# Patient Record
Sex: Female | Born: 1985 | Race: White | Hispanic: No | Marital: Married | Smoking: Former smoker
Health system: Southern US, Community
[De-identification: ages and names within clinical notes are randomized; demographics above are authoritative.]

## PROBLEM LIST (undated history)

## (undated) DIAGNOSIS — F419 Anxiety disorder, unspecified: Secondary | ICD-10-CM

## (undated) DIAGNOSIS — F32A Depression, unspecified: Secondary | ICD-10-CM

---
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
## 2007-06-15 ENCOUNTER — Emergency Department (HOSPITAL_COMMUNITY): Admission: EM | Admit: 2007-06-15 | Discharge: 2007-06-15 | Payer: Self-pay | Admitting: Emergency Medicine

## 2007-09-19 ENCOUNTER — Emergency Department (HOSPITAL_COMMUNITY): Admission: EM | Admit: 2007-09-19 | Discharge: 2007-09-19 | Payer: Self-pay | Admitting: Emergency Medicine

## 2007-10-24 ENCOUNTER — Emergency Department (HOSPITAL_COMMUNITY): Admission: EM | Admit: 2007-10-24 | Discharge: 2007-10-25 | Payer: Self-pay | Admitting: Emergency Medicine

## 2008-09-05 ENCOUNTER — Inpatient Hospital Stay (HOSPITAL_COMMUNITY): Admission: AD | Admit: 2008-09-05 | Discharge: 2008-09-07 | Payer: Self-pay | Admitting: Family Medicine

## 2008-09-05 ENCOUNTER — Ambulatory Visit: Payer: Self-pay | Admitting: Obstetrics and Gynecology

## 2008-09-10 ENCOUNTER — Inpatient Hospital Stay (HOSPITAL_COMMUNITY): Admission: AD | Admit: 2008-09-10 | Discharge: 2008-09-10 | Payer: Self-pay | Admitting: Obstetrics & Gynecology

## 2009-01-07 IMAGING — CR DG WRIST COMPLETE 3+V*R*
4 series · 4 of 4 positions shown · non-contrast
Comparison: none

CLINICAL DATA: Fall, wrist pain

RIGHT WRIST - 4 VIEW

[x wrist pa right]
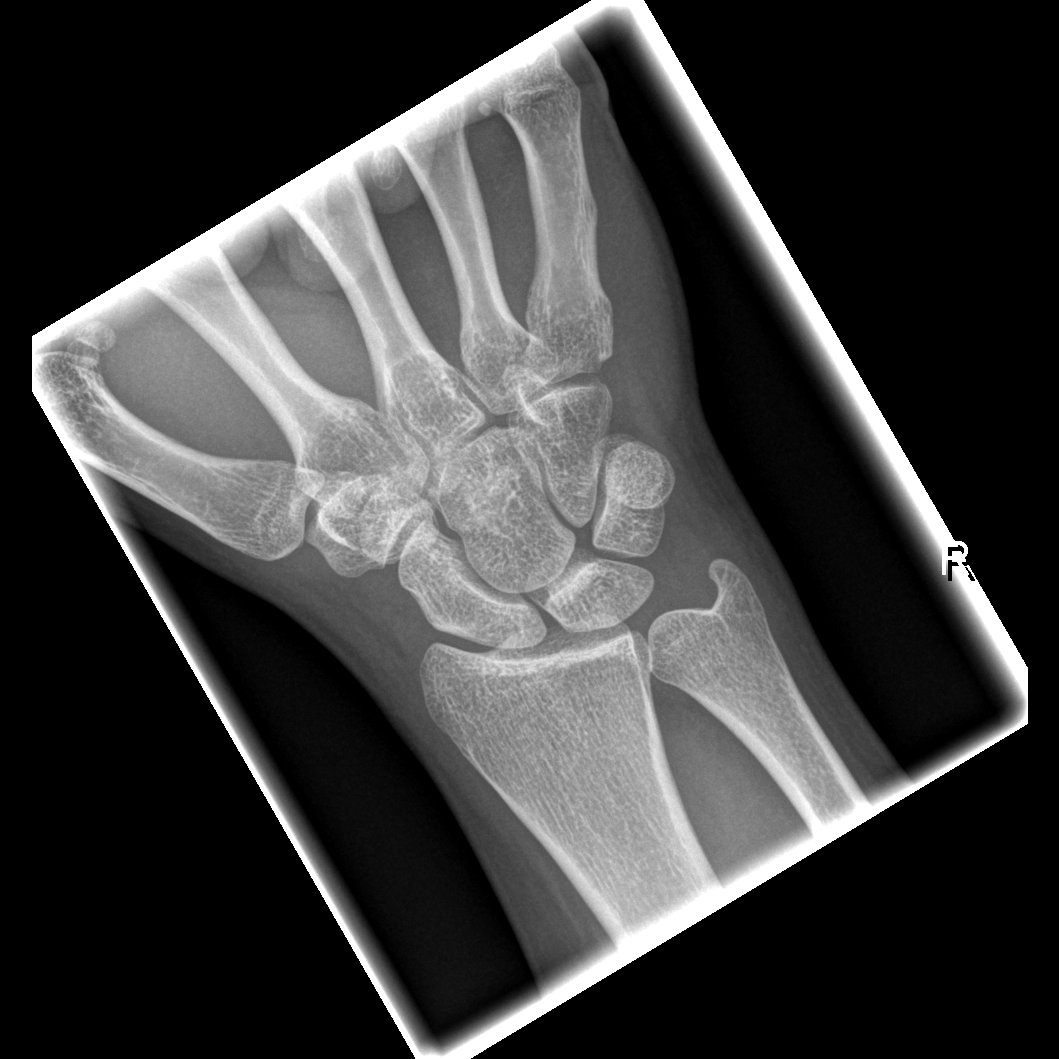

[x wrist obl right]
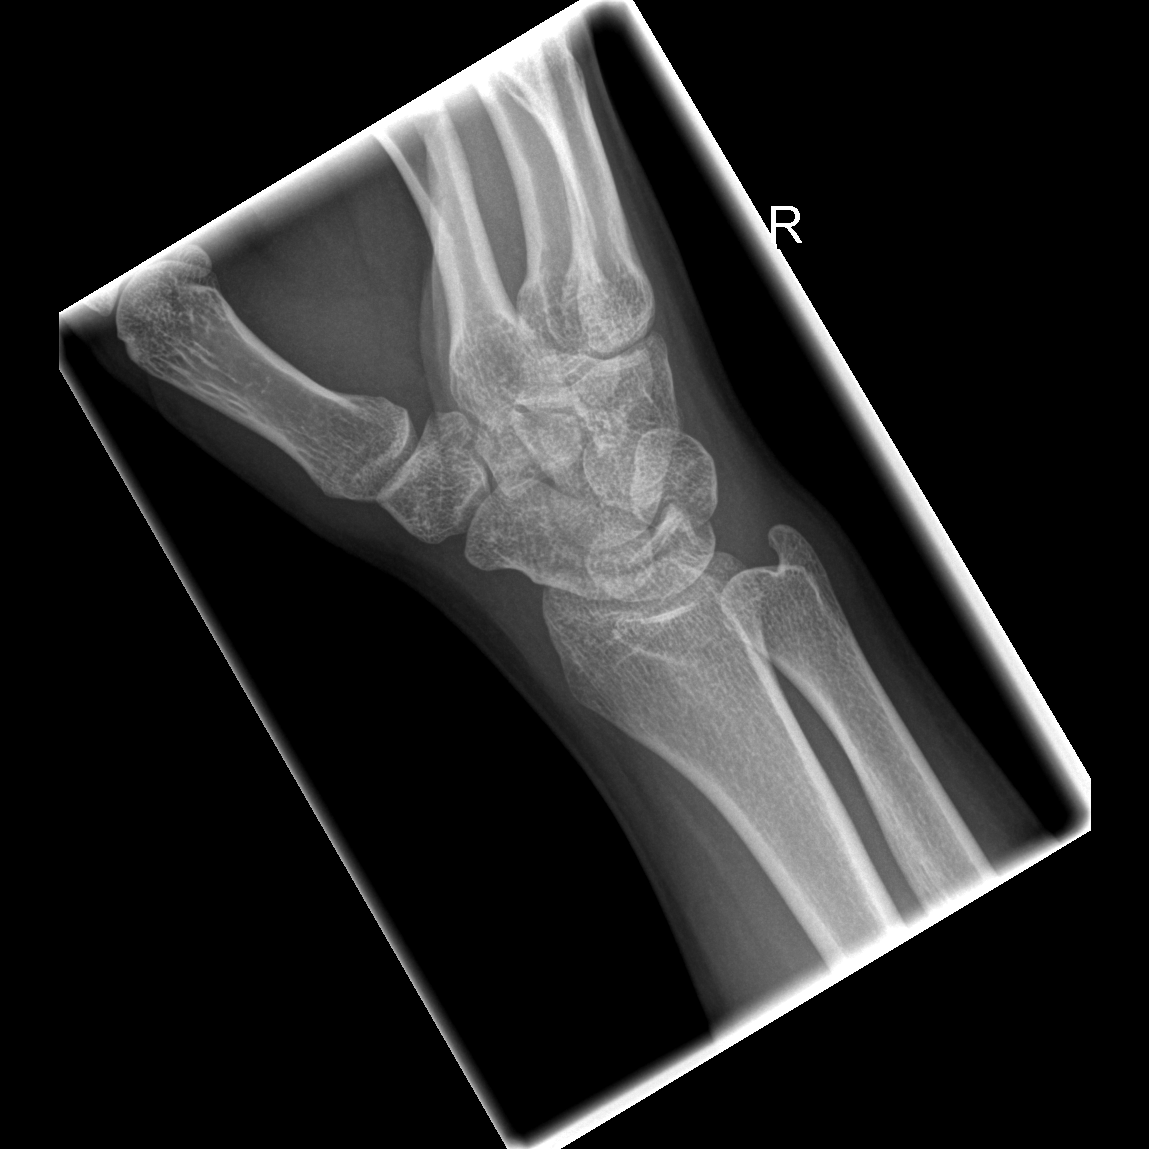

[x wrist lat right]
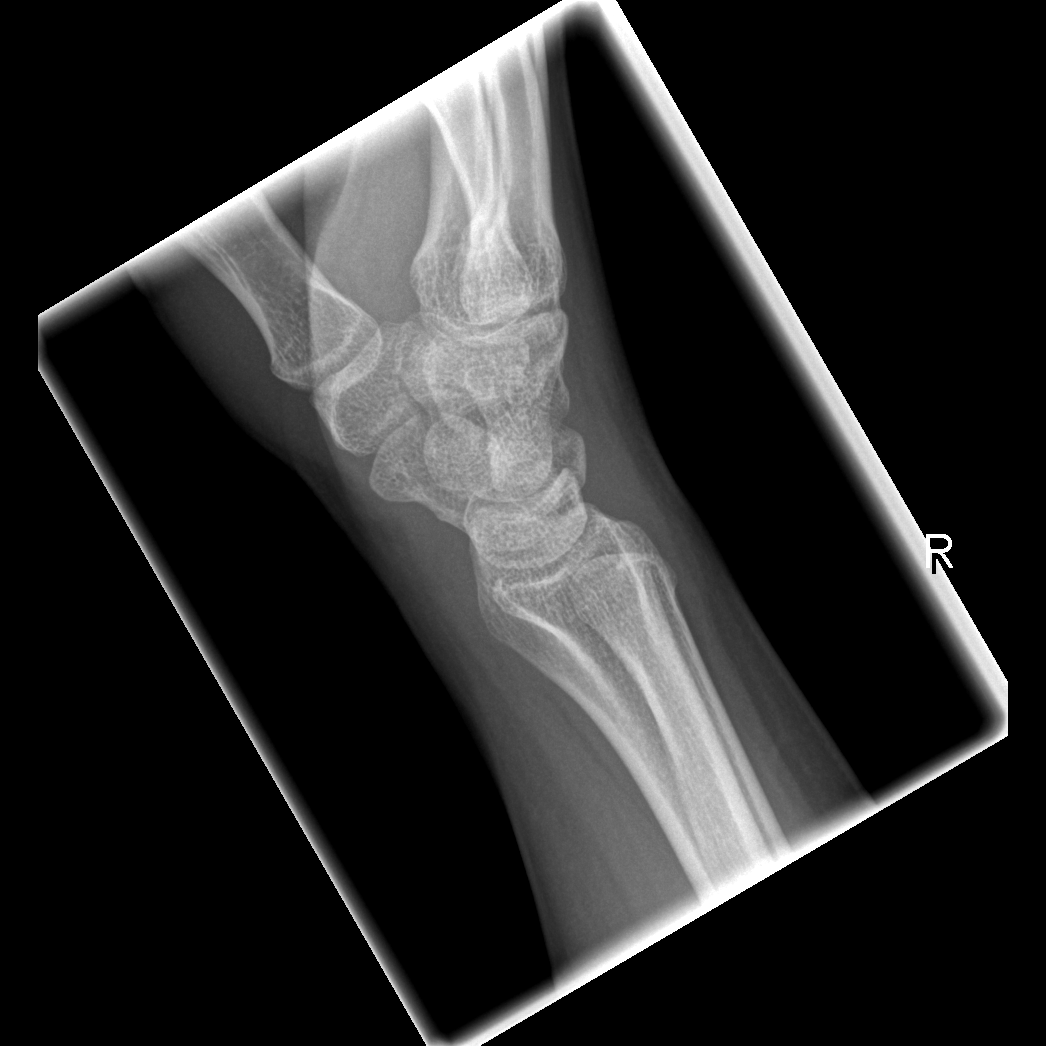

[x navicular]
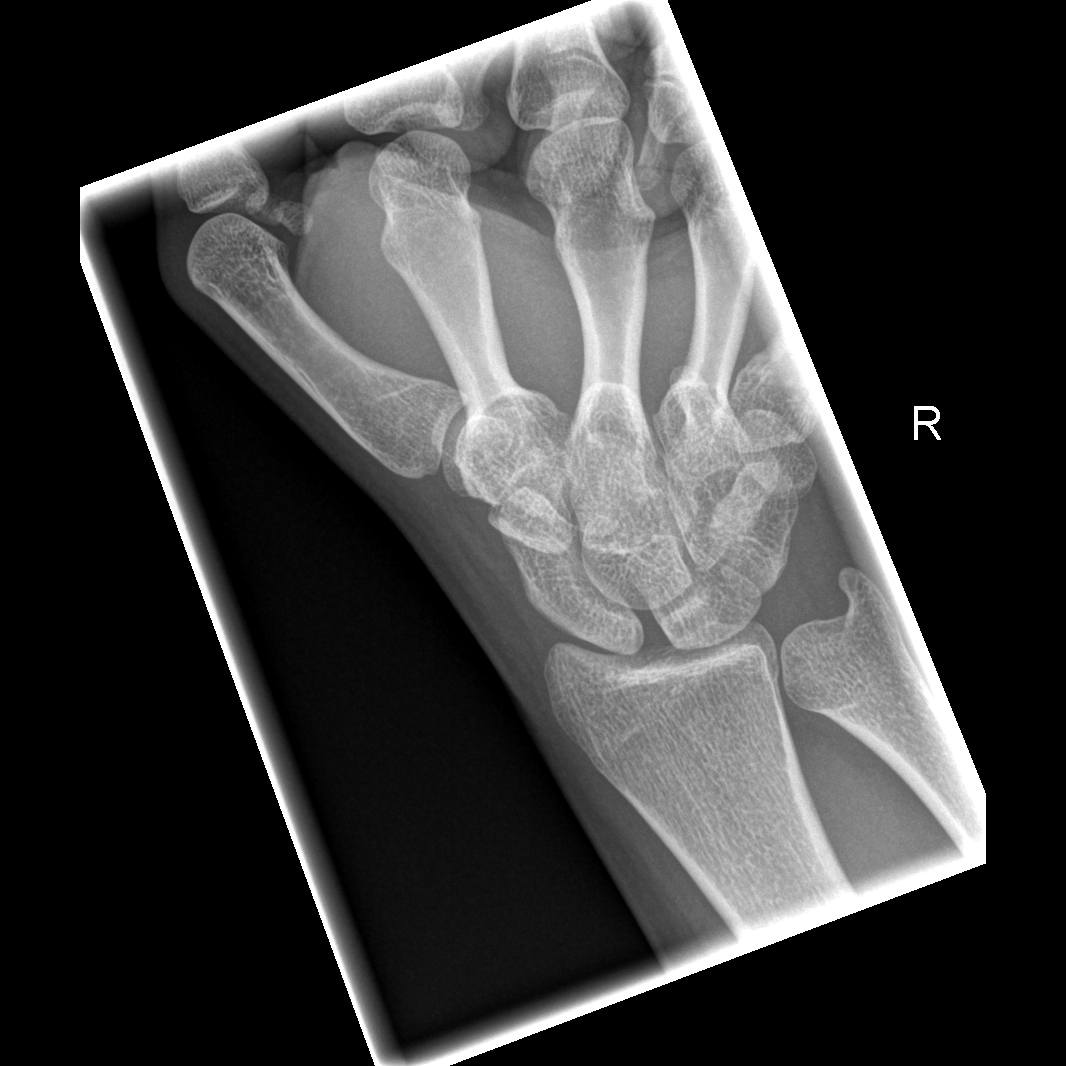

[4 of 4 positions shown; findings below may reference images not displayed]

FINDINGS: On the oblique view, there is slight apparent widening of the
scapholunate distance. This is not appreciated on the AP view and may simply be
related to the obliquity. No fracture. Soft tissues are intact.

IMPRESSION

Question slight widening of the scapholunate space. If clinical concern
persists, consider elective MRI.

## 2010-10-16 LAB — CBC
HCT: 29.3 % — ABNORMAL LOW (ref 36.0–46.0)
HCT: 32.9 % — ABNORMAL LOW (ref 36.0–46.0)
Hemoglobin: 11 g/dL — ABNORMAL LOW (ref 12.0–15.0)
Hemoglobin: 9.6 g/dL — ABNORMAL LOW (ref 12.0–15.0)
MCHC: 33 g/dL (ref 30.0–36.0)
MCHC: 33.5 g/dL (ref 30.0–36.0)
MCV: 93.1 fL (ref 78.0–100.0)
MCV: 94.3 fL (ref 78.0–100.0)
Platelets: 210 10*3/uL (ref 150–400)
RBC: 3.1 MIL/uL — ABNORMAL LOW (ref 3.87–5.11)
RBC: 3.53 MIL/uL — ABNORMAL LOW (ref 3.87–5.11)
RDW: 13.6 % (ref 11.5–15.5)
WBC: 16.8 10*3/uL — ABNORMAL HIGH (ref 4.0–10.5)
WBC: 17.6 10*3/uL — ABNORMAL HIGH (ref 4.0–10.5)

## 2010-10-16 LAB — URINE MICROSCOPIC-ADD ON

## 2010-10-16 LAB — URINALYSIS, ROUTINE W REFLEX MICROSCOPIC
Bilirubin Urine: NEGATIVE
Glucose, UA: NEGATIVE mg/dL
Ketones, ur: NEGATIVE mg/dL
Specific Gravity, Urine: >1.03 — ABNORMAL HIGH (ref 1.005–1.030)
pH: 6 (ref 5.0–8.0)

## 2010-10-16 LAB — RPR: RPR Ser Ql: NONREACTIVE

## 2010-10-16 LAB — CCBB MATERNAL DONOR DRAW

## 2010-10-16 LAB — URINE CULTURE

## 2011-03-31 LAB — DIFFERENTIAL
Basophils Absolute: 0
Basophils Relative: 0
Eosinophils Absolute: 0
Eosinophils Relative: 0
Lymphocytes Relative: 4 — ABNORMAL LOW
Lymphs Abs: 0.6 — ABNORMAL LOW
Monocytes Absolute: 0.4
Monocytes Relative: 3
Neutro Abs: 13.5 — ABNORMAL HIGH
Neutrophils Relative %: 93 — ABNORMAL HIGH

## 2011-03-31 LAB — URINALYSIS, ROUTINE W REFLEX MICROSCOPIC
Glucose, UA: NEGATIVE
Hgb urine dipstick: NEGATIVE
Nitrite: NEGATIVE
Protein, ur: NEGATIVE
Specific Gravity, Urine: 1.04 — ABNORMAL HIGH
Urobilinogen, UA: 1
pH: 7

## 2011-03-31 LAB — POCT PREGNANCY, URINE
Operator id: 299821
Preg Test, Ur: NEGATIVE

## 2011-03-31 LAB — CBC
HCT: 39.9
Hemoglobin: 13.6
MCHC: 34
MCV: 91.4
Platelets: 201
RBC: 4.36
RDW: 13.3
WBC: 14.5 — ABNORMAL HIGH

## 2011-03-31 LAB — BASIC METABOLIC PANEL WITH GFR
BUN: 11
Chloride: 101
Creatinine, Ser: 0.65
GFR calc non Af Amer: >60

## 2011-03-31 LAB — URINE MICROSCOPIC-ADD ON

## 2011-03-31 LAB — BASIC METABOLIC PANEL
CO2: 29
Calcium: 9.3
GFR calc Af Amer: >60
Glucose, Bld: 113 — ABNORMAL HIGH
Potassium: 3.6
Sodium: 139

## 2011-08-13 ENCOUNTER — Encounter (HOSPITAL_COMMUNITY): Payer: Self-pay | Admitting: Emergency Medicine

## 2011-08-13 ENCOUNTER — Emergency Department (HOSPITAL_COMMUNITY)
Admission: EM | Admit: 2011-08-13 | Discharge: 2011-08-13 | Disposition: A | Discharge status: Home / Self Care | Payer: Self-pay | Attending: Emergency Medicine | Admitting: Emergency Medicine

## 2011-08-13 DIAGNOSIS — X58XXXA Exposure to other specified factors, initial encounter: Secondary | ICD-10-CM | POA: Insufficient documentation

## 2011-08-13 DIAGNOSIS — S025XXA Fracture of tooth (traumatic), initial encounter for closed fracture: Secondary | ICD-10-CM | POA: Insufficient documentation

## 2011-08-13 DIAGNOSIS — K089 Disorder of teeth and supporting structures, unspecified: Secondary | ICD-10-CM | POA: Insufficient documentation

## 2011-08-13 MED ORDER — OXYCODONE-ACETAMINOPHEN 7.5-325 MG PO TABS: 1.0000 | ORAL_TABLET | ORAL | Status: AC | PRN

## 2011-08-13 MED ORDER — IBUPROFEN 600 MG PO TABS: 600.0000 mg | ORAL_TABLET | Freq: Four times a day (QID) | ORAL | Status: AC | PRN

## 2011-08-13 NOTE — ED Provider Notes (Signed)
History     CSN: [id number]  Arrival date & time 08/13/11  1531   First MD Initiated Contact with Patient 08/13/11 1615      Chief Complaint  Patient presents with  . Dental Pain     HPI Pt c/o left upper tooth pain after tooth brook off this am  History reviewed. No pertinent past medical history.  History reviewed. No pertinent past surgical history.  History reviewed. No pertinent family history.  History  Substance Use Topics  . Smoking status: Former Games developer  . Smokeless tobacco: Not on file  . Alcohol Use: Yes     occasional    OB History    Grav Para Term Preterm Abortions TAB SAB Ect Mult Living                  Review of Systems Negative except as noted in history of present illness Allergies  Penicillins  Home Medications   Current Outpatient Rx  Name Route Sig Dispense Refill  . ADULT MULTIVITAMIN W/MINERALS CH Oral Take 1 tablet by mouth daily.    . IBUPROFEN 600 MG PO TABS Oral Take 1 tablet (600 mg total) by mouth every 6 (six) hours as needed for pain. 30 tablet 0  . OXYCODONE-ACETAMINOPHEN 7.5-325 MG PO TABS Oral Take 1 tablet by mouth every 4 (four) hours as needed for pain. 30 tablet 0    BP 143/64  Pulse 78  Temp(Src) 97.5 F (36.4 C) (Oral)  Resp 18  SpO2 100%  Physical Exam  Nursing note and vitals reviewed. Constitutional: She is oriented to person, place, and time. She appears well-developed and well-nourished. No distress.  HENT:  Head: Normocephalic and atraumatic.  Mouth/Throat:    Eyes: Pupils are equal, round, and reactive to light.  Neck: Normal range of motion.  Cardiovascular: Normal rate and intact distal pulses.   Pulmonary/Chest: No respiratory distress.  Abdominal: Normal appearance. She exhibits no distension.  Musculoskeletal: Normal range of motion.  Neurological: She is alert and oriented to person, place, and time. No cranial nerve deficit.  Skin: Skin is warm and dry. No rash noted.  Psychiatric: She has  a normal mood and affect. Her behavior is normal.    ED Course  Procedures (including critical care time)  Labs Reviewed - No data to display No results found.   1. Fractured tooth       MDM         Nelia Shi, MD 08/13/11 [phone]

## 2011-08-13 NOTE — ED Notes (Signed)
Pt c/o left upper tooth pain after tooth brook off this am

## 2018-07-19 ENCOUNTER — Emergency Department (HOSPITAL_COMMUNITY): Payer: BLUE CROSS/BLUE SHIELD

## 2018-07-19 ENCOUNTER — Emergency Department (HOSPITAL_COMMUNITY)
Admission: EM | Admit: 2018-07-19 | Discharge: 2018-07-19 | Disposition: A | Discharge status: Home / Self Care | Payer: BLUE CROSS/BLUE SHIELD | Attending: Emergency Medicine | Admitting: Emergency Medicine

## 2018-07-19 ENCOUNTER — Encounter (HOSPITAL_COMMUNITY): Payer: Self-pay

## 2018-07-19 ENCOUNTER — Other Ambulatory Visit: Payer: Self-pay

## 2018-07-19 DIAGNOSIS — O4691 Antepartum hemorrhage, unspecified, first trimester: Secondary | ICD-10-CM | POA: Insufficient documentation

## 2018-07-19 DIAGNOSIS — Z79899 Other long term (current) drug therapy: Secondary | ICD-10-CM | POA: Insufficient documentation

## 2018-07-19 DIAGNOSIS — Z3A10 10 weeks gestation of pregnancy: Secondary | ICD-10-CM | POA: Diagnosis not present

## 2018-07-19 DIAGNOSIS — Z87891 Personal history of nicotine dependence: Secondary | ICD-10-CM | POA: Diagnosis not present

## 2018-07-19 DIAGNOSIS — R8271 Bacteriuria: Secondary | ICD-10-CM | POA: Diagnosis not present

## 2018-07-19 DIAGNOSIS — O4690 Antepartum hemorrhage, unspecified, unspecified trimester: Secondary | ICD-10-CM

## 2018-07-19 LAB — URINALYSIS, ROUTINE W REFLEX MICROSCOPIC
Bilirubin Urine: NEGATIVE
GLUCOSE, UA: NEGATIVE mg/dL
KETONES UR: NEGATIVE mg/dL
Nitrite: NEGATIVE
PH: 5 (ref 5.0–8.0)
Protein, ur: NEGATIVE mg/dL
Specific Gravity, Urine: 1.012 (ref 1.005–1.030)

## 2018-07-19 LAB — CBC
HEMATOCRIT: 37.9 % (ref 36.0–46.0)
HEMOGLOBIN: 12.4 g/dL (ref 12.0–15.0)
MCH: 30.5 pg (ref 26.0–34.0)
MCHC: 32.7 g/dL (ref 30.0–36.0)
MCV: 93.3 fL (ref 80.0–100.0)
Platelets: 273 10*3/uL (ref 150–400)
RBC: 4.06 MIL/uL (ref 3.87–5.11)
RDW: 12.4 % (ref 11.5–15.5)
WBC: 11.6 10*3/uL — AB (ref 4.0–10.5)
nRBC: 0 % (ref 0.0–0.2)

## 2018-07-19 LAB — BASIC METABOLIC PANEL
Anion gap: 10 (ref 5–15)
BUN: 7 mg/dL (ref 6–20)
CHLORIDE: 103 mmol/L (ref 98–111)
CO2: 22 mmol/L (ref 22–32)
Calcium: 9.3 mg/dL (ref 8.9–10.3)
Creatinine, Ser: 0.57 mg/dL (ref 0.44–1.00)
GFR calc Af Amer: >60 mL/min (ref >60)
GFR calc non Af Amer: >60 mL/min (ref >60)
Glucose, Bld: 96 mg/dL (ref 70–99)
POTASSIUM: 4.1 mmol/L (ref 3.5–5.1)
SODIUM: 135 mmol/L (ref 135–145)

## 2018-07-19 LAB — WET PREP, GENITAL
Clue Cells Wet Prep HPF POC: NONE SEEN
SPERM: NONE SEEN
TRICH WET PREP: NONE SEEN
Yeast Wet Prep HPF POC: NONE SEEN

## 2018-07-19 LAB — HCG, QUANTITATIVE, PREGNANCY: hCG, Beta Chain, Quant, S: 62980 m[IU]/mL — ABNORMAL HIGH (ref <5)

## 2018-07-19 MED ORDER — CEPHALEXIN 500 MG PO CAPS: 500.0000 mg | ORAL_CAPSULE | Freq: Two times a day (BID) | ORAL | 0 refills | Status: DC

## 2018-07-19 MED ORDER — ACETAMINOPHEN 325 MG PO TABS
650.0000 mg | ORAL_TABLET | Freq: Once | ORAL | Status: AC
  Administered 2018-07-19: 650 mg via ORAL
  Filled 2018-07-19: qty 2

## 2018-07-19 NOTE — ED Provider Notes (Signed)
[facility]   CSN: [id number] Arrival date & time: 07/19/18  1737     History   Chief Complaint Chief Complaint  Patient presents with  . Threatened Miscarriage    HPI Rebecca Arnold is a 33 y.o. female with a hx of G2P1A0 who presents to the ED with vaginal bleeding that started 30 minutes prior to ED arrival. Patient states she is approximately [redacted] weeks pregnant, currently w/ established OB care by Dr. Conard NovakEisenberg w/ [facility], had US that confirmed IUP. She relays today she started to have some vaginal bleeding describes as moderate amount of  Blood on the toilet paper when wiping. This has continued but decreased. No alleviating/aggravating factors. Denies associated abdominal pain, back pain, dysuria, nausea, vomiting, or diarrhea. She relays she did have abnormal cervical cytology and went for biopsy last week. She states after the procedure she was having some discharge, but nothing substantially abnormal, it was a bit pruritic she applied monistat with some improvement over past 24 hours.    Prior Results Reviewed:  07/19/17:  U/S: SINGLE GEST SAC, YOLK SAC AND IUP NOTED, + FHT'S 178 BPM, CX LONG &  CLOSED, MEAS C/W DATES (8+ WKS), BIL ADNEXA WNL - CLB  Ultrasound preformed to confirm viability. Pt was given her gestational  age as stated on the ultrasound report and EDC. Full anatomy scan will be  done at 18-22 weeks.   The above ultrasound was reviewed and interpreted by Dr. Evaristo BuryBarbara Eisenberg  HPI  History reviewed. No pertinent past medical history.  There are no active problems to display for this patient.   History reviewed. No pertinent surgical history.   OB History    Gravida  1   Para      Term      Preterm      AB      Living        SAB      TAB      Ectopic      Multiple      Live Births               Home Medications    Prior to Admission medications   Medication Sig Start  Date End Date Taking? Authorizing Provider  Multiple Vitamin (MULITIVITAMIN WITH MINERALS) TABS Take 1 tablet by mouth daily.    [provider]    Family History History reviewed. No pertinent family history.  Social History Social History   Tobacco Use  . Smoking status: Former Games developermoker  . Smokeless tobacco: Never Used  Substance Use Topics  . Alcohol use: Yes    Comment: occasional  . Drug use: No     Allergies   Penicillins   Review of Systems Review of Systems  Constitutional: Negative for chills and fever.  Respiratory: Negative for shortness of breath.   Cardiovascular: Negative for chest pain.  Gastrointestinal: Negative for abdominal pain, nausea and vomiting.  Genitourinary: Positive for vaginal bleeding and vaginal discharge. Negative for dysuria.  Musculoskeletal: Negative for back pain.  All other systems reviewed and are negative.    Physical Exam Updated Vital Signs BP (!) 143/88 (BP Location: Right Arm)   Pulse 92   Temp 97.8 F (36.6 C) (Oral)   Resp 18   SpO2 99%   Physical Exam Vitals signs and nursing note reviewed. Exam conducted with a chaperone present.  Constitutional:      General: She is not in acute  distress.    Appearance: She is well-developed. She is not toxic-appearing.  HENT:     Head: Normocephalic and atraumatic.  Eyes:     General:        Right eye: No discharge.        Left eye: No discharge.     Conjunctiva/sclera: Conjunctivae normal.  Neck:     Musculoskeletal: Neck supple.  Cardiovascular:     Rate and Rhythm: Normal rate and regular rhythm.  Pulmonary:     Effort: Pulmonary effort is normal. No respiratory distress.     Breath sounds: Normal breath sounds. No wheezing, rhonchi or rales.  Abdominal:     General: There is no distension.     Palpations: Abdomen is soft.     Tenderness: There is no abdominal tenderness. There is no guarding or rebound.  Genitourinary:    Labia:        Right: No rash or  tenderness.        Left: No rash or tenderness.      Vagina: No bleeding.     Cervix: Discharge (white thin) present. No cervical motion tenderness or cervical bleeding.     Adnexa:        Right: No mass or tenderness.         Left: No mass or tenderness.    Skin:    General: Skin is warm and dry.     Findings: No rash.  Neurological:     Mental Status: She is alert.     Comments: Clear speech.   Psychiatric:        Behavior: Behavior normal.    ED Treatments / Results  Labs (all labs ordered are listed, but only abnormal results are displayed) Labs Reviewed  HCG, QUANTITATIVE, PREGNANCY - Abnormal; Notable for the following components:      Result Value   hCG, Beta Chain, Quant, S 62,980 (*)    All other components within normal limits  CBC - Abnormal; Notable for the following components:   WBC 11.6 (*)    All other components within normal limits  WET PREP, GENITAL  BASIC METABOLIC PANEL  URINALYSIS, ROUTINE W REFLEX MICROSCOPIC  GC/CHLAMYDIA PROBE AMP ([place]) NOT AT [facility]   EKG None  Radiology US Ob Comp < 14 Wks  Result Date: 07/19/2018 CLINICAL DATA:  Pregnant, vaginal bleeding EXAM: OBSTETRIC <14 WK ULTRASOUND TECHNIQUE: Transabdominal ultrasound was performed for evaluation of the gestation as well as the maternal uterus and adnexal regions. COMPARISON:  None. FINDINGS: Intrauterine gestational sac: Single Yolk sac:  Visualized. Embryo:  Visualized. Cardiac Activity: Visualized. Heart Rate: 169 bpm CRL:   31 mm   10 w 0 d                  [place] EDC: 02/14/2019 Subchorionic hemorrhage:  None visualized. Maternal uterus/adnexae: Bilateral ovaries are within normal limits. No free fluid. IMPRESSION: Single live intrauterine gestation, with estimated gestational age [redacted] weeks 0 days by crown-rump length, as above. Electronically Signed   By: Charline Bills M.D.   On: 07/19/2018 21:22    Procedures Procedures (including critical care time)  Medications Ordered in  ED Medications - No data to display   Initial Impression / Assessment and Plan / ED Course  I have reviewed the triage vital signs and the nursing notes.  Pertinent labs & imaging results that were available during my care of the patient were reviewed by me and considered in my medical decision  making (see chart for details).   Patient is G2, P1 currently approximately [redacted] weeks pregnant presenting to the emergency department with vaginal bleeding.  She is nontoxic-appearing, in no apparent distress, vitals notable for mildly elevated blood pressure, otherwise within normal limits.  She has a fairly benign physical exam.  She has no abdominal tenderness or tenderness on bimanual exam.  No appreciable vaginal bleeding.  She does have some white discharge present around the cervix and in the vaginal canal.   Labs reviewed: Nonspecific leukocytosis at 11.6. no anemia. No electrolyte derangement. Renal function preserved. Wet prep with many WBCs, no BV, trich, or yeast. GC/chlamydia pending, low suspicion for PID. Quantitative hCG is 16,109662,9820. US performed w/ findings consistent w/ single live intrauterine gestation, with estimated gestational age [redacted] weeks 0 days by crown-rump length, as above. Prior records reviewed- patient is Rh positive, no need for rhogam. Urinalysis findings concerning for infection, asymptomatic in pregnancy, will treat with Keflex- PCN allergic- unclear allergy as a child- no definitive anaphylaxis type resopnse- discussed with Dr. Jacqulyn BathLong- in agreement w/ this abx choice, return precautions given.   Overall reassuring ER work-up. Follow up with OB. I discussed results, treatment plan, need for follow-up, and return precautions with the patient. Provided opportunity for questions, patient confirmed understanding and is in agreement with plan.   Final Clinical Impressions(s) / ED Diagnoses   Final diagnoses:  Vaginal bleeding in pregnancy  Bacteriuria    ED Discharge Orders          Ordered    cephALEXin (KEFLEX) 500 MG capsule  2 times daily     07/19/18 2156           Cherly Andersonetrucelli, Jovani Colquhoun R, PA-C 07/19/18 2209    Maia PlanLong, Joshua G, MD 07/19/18 [phone]

## 2018-07-19 NOTE — ED Triage Notes (Signed)
Pt here with vaginal bleeding that began 30 min prior to arrival.  Pt is [redacted] weeks pregnant and seen in [facility] for care.  Ultrasound done last week.  Pt states had approximately handful of blood with one episode.

## 2018-07-19 NOTE — Discharge Instructions (Addendum)
You are seen in the ER today for vaginal bleeding.  Your ultrasound showed a normal pregnancy at this time.  Your blood work was all fairly normal.   Your urine did have some bacteria, we are placing you on keflex for this.   We have prescribed you new medication(s) today. Discuss the medications prescribed today with your pharmacist as they can have adverse effects and interactions with your other medicines including over the counter and prescribed medications. Seek medical evaluation if you start to experience new or abnormal symptoms after taking one of these medicines, seek care immediately if you start to experience difficulty breathing, feeling of your throat closing, facial swelling, or rash as these could be indications of a more serious allergic reaction   We would like you to follow-up with Dr. Conard Novak within the next 1 week.  Return to the ER for new or worsening symptoms or any other concerns.

## 2018-07-20 LAB — GC/CHLAMYDIA PROBE AMP (~~LOC~~) NOT AT ARMC
CHLAMYDIA, DNA PROBE: NEGATIVE
NEISSERIA GONORRHEA: NEGATIVE

## 2018-07-21 LAB — URINE CULTURE

## 2020-02-11 IMAGING — US US OB COMP LESS 14 WK
2 series · 14 of 28 positions shown · non-contrast
Comparison: None.

CLINICAL DATA: Pregnant, vaginal bleeding

EXAM:
OBSTETRIC <14 WK ULTRASOUND
TECHNIQUE: Transabdominal ultrasound was performed for evaluation of the
gestation as well as the maternal uterus and adnexal regions.

[Series 1: us ob comp less 14 wk · 30 acquisitions, 10 frames shown (1 of 2)]
[im 2/30]
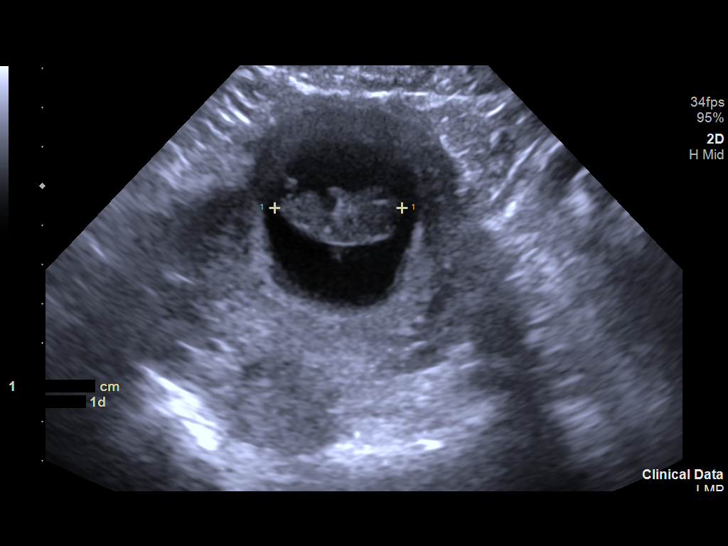
[im 5/30]
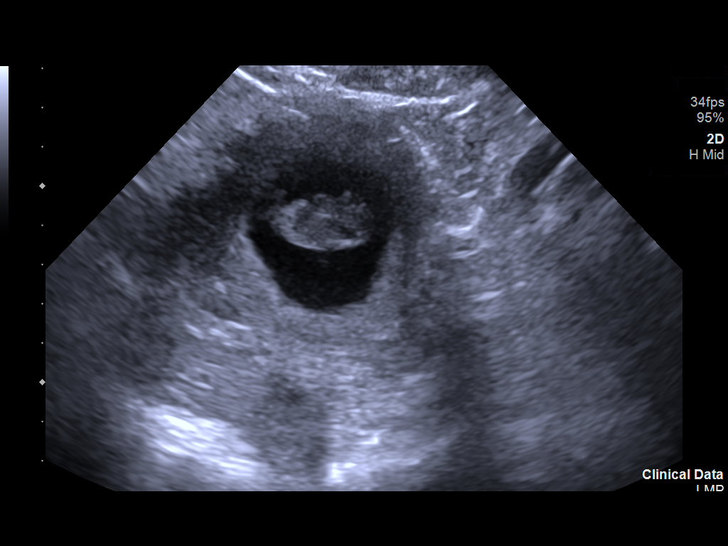
[im 8/30]
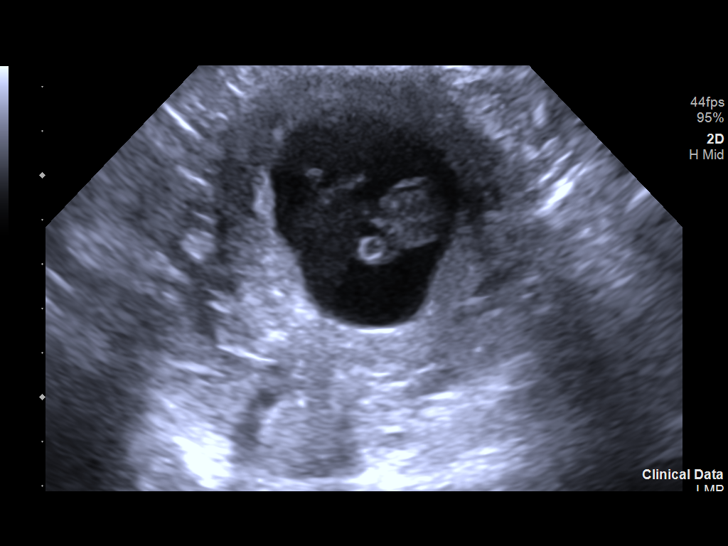
[im 11/30]
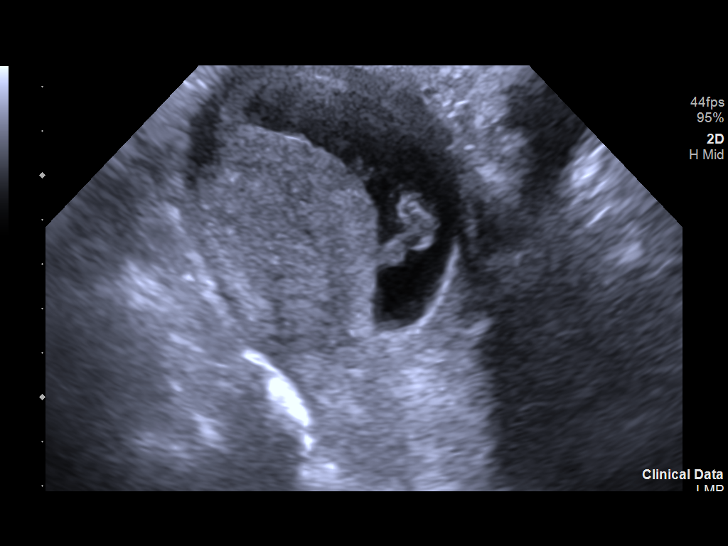
[im 14/30]
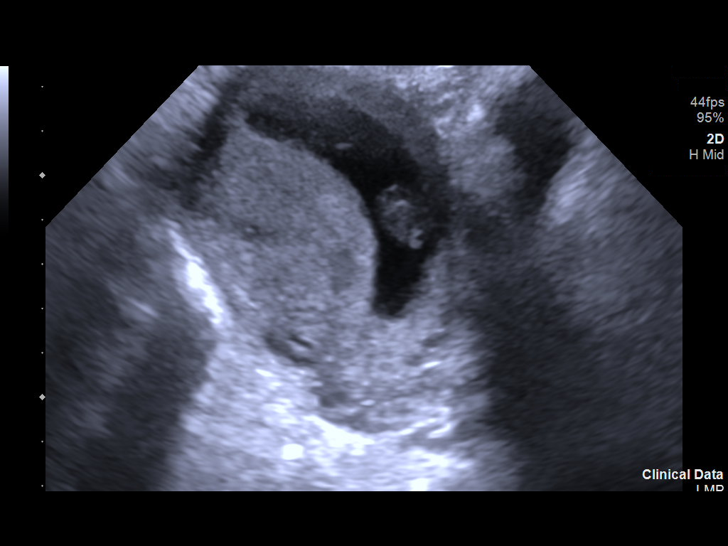
[im 16/30]
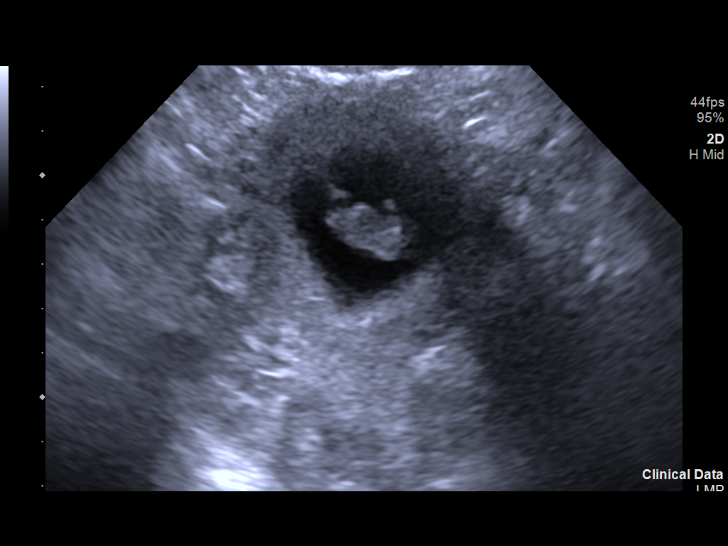
[im 19/30]
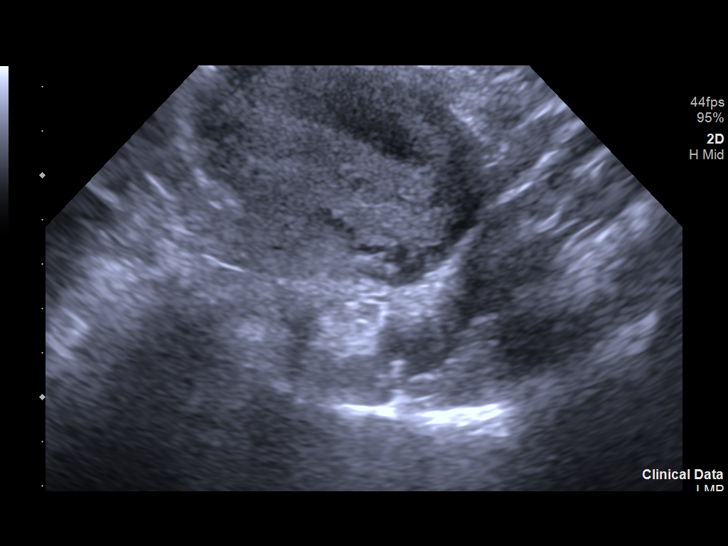
[im 22/30]
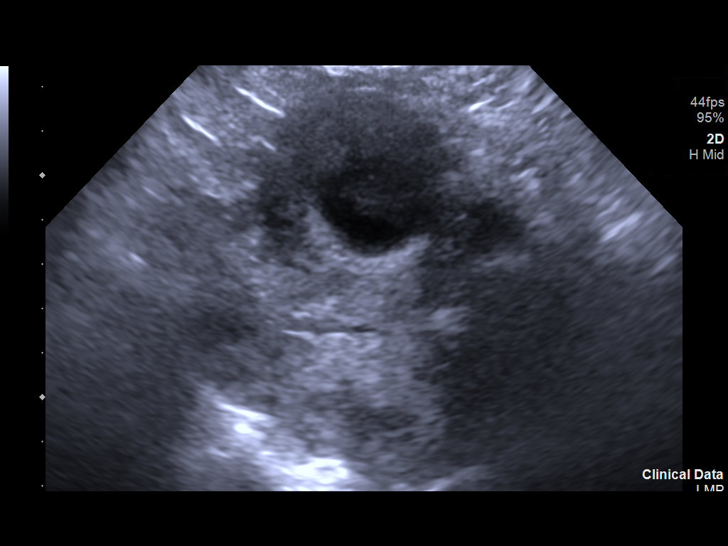
[im 25/30]
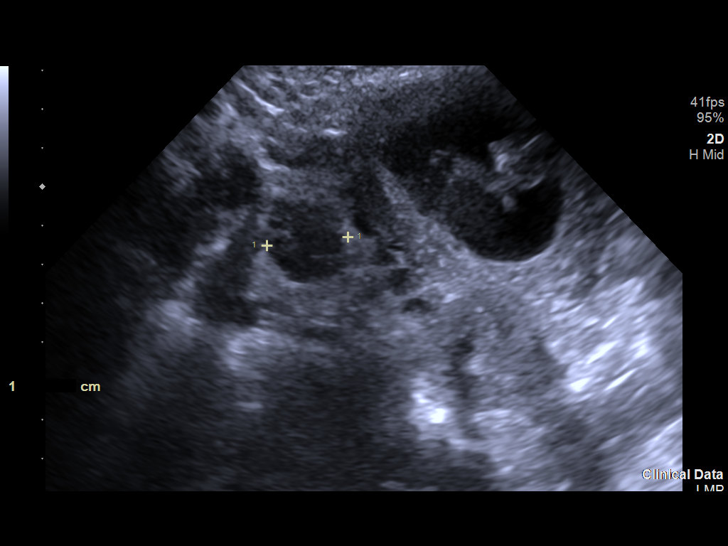
[im 28/30]
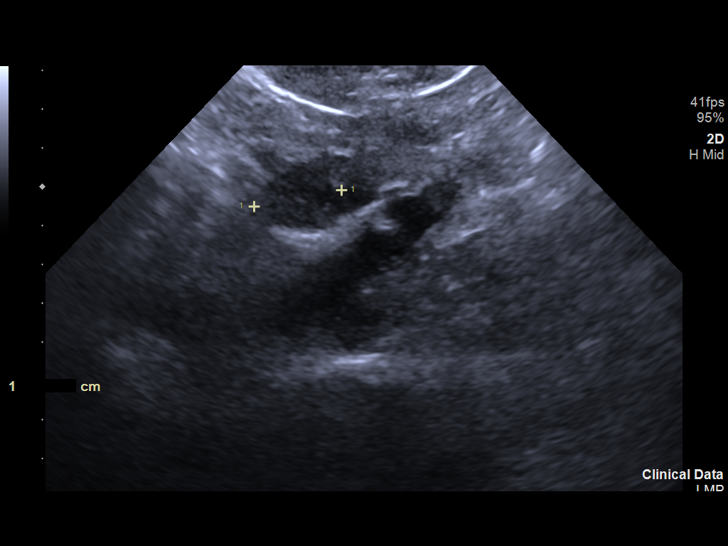

[Series 1001: us ob comp less 14 wk · 10 acquisitions, 4 frames shown (2 of 2)]
[im 1/10]
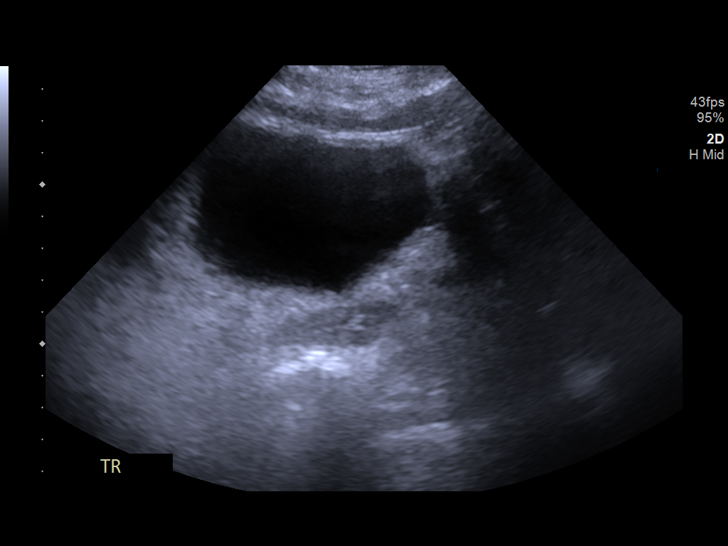
[im 4/10]
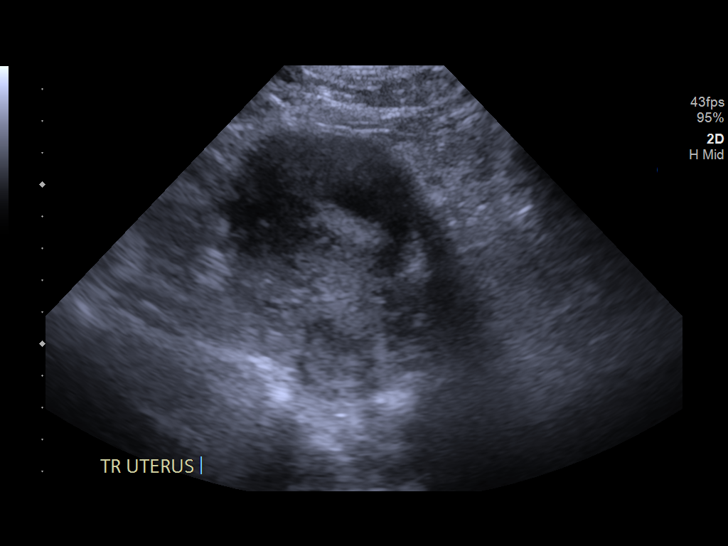
[im 7/10]
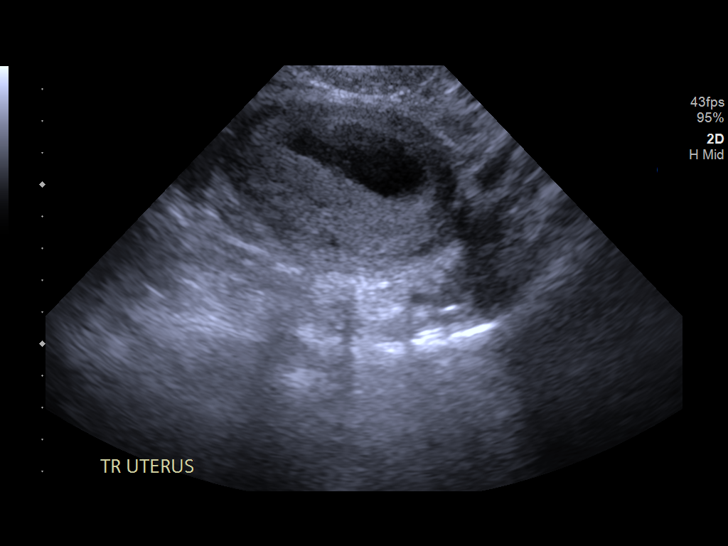
[im 10/10]
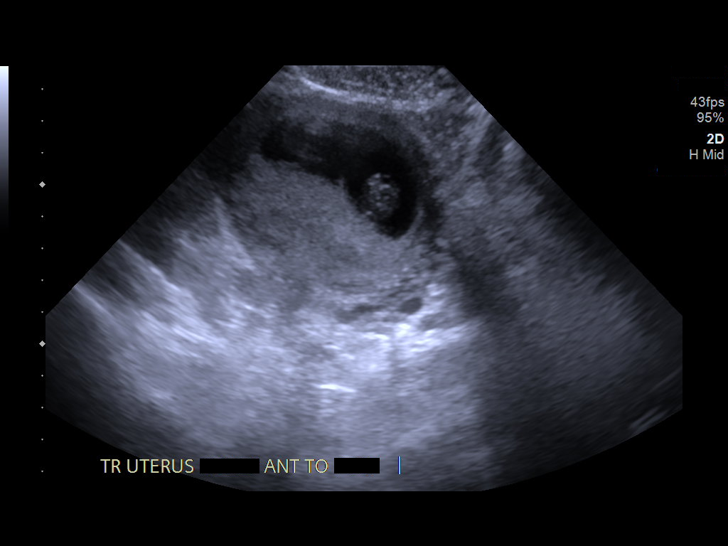

[14 of 28 positions shown; findings below may reference images not displayed]

FINDINGS: Intrauterine gestational sac: Single

Yolk sac:  Visualized.

Embryo:  Visualized.

Cardiac Activity: Visualized.

Heart Rate: 169 bpm

CRL:   31 mm   10 w 0 d                  US EDC: 02/14/2019

Subchorionic hemorrhage:  None visualized.

Maternal uterus/adnexae: Bilateral ovaries are within normal limits.

No free fluid.
IMPRESSION: Single live intrauterine gestation, with estimated gestational age
10 weeks 0 days by crown-rump length, as above.

## 2022-08-15 ENCOUNTER — Ambulatory Visit (HOSPITAL_COMMUNITY)
Admission: EM | Admit: 2022-08-15 | Discharge: 2022-08-15 | Disposition: A | Discharge status: Home / Self Care | Payer: BC Managed Care – PPO | Attending: Behavioral Health | Admitting: Behavioral Health

## 2022-08-15 DIAGNOSIS — F4323 Adjustment disorder with mixed anxiety and depressed mood: Secondary | ICD-10-CM | POA: Diagnosis not present

## 2022-08-15 MED ORDER — VENLAFAXINE HCL ER 150 MG PO CP24: 150.0000 mg | ORAL_CAPSULE | Freq: Every day | ORAL | 0 refills | Status: DC

## 2022-08-15 MED ORDER — HYDROXYZINE PAMOATE 25 MG PO CAPS: 25.0000 mg | ORAL_CAPSULE | Freq: Three times a day (TID) | ORAL | 0 refills | Status: DC | PRN

## 2022-08-15 NOTE — ED Provider Notes (Signed)
Behavioral Health Urgent Care Medical Screening Exam  Patient Name: Rebecca Arnold MRN: [id number] Date of Evaluation: 08/15/22 Chief Complaint:  depression and anxiety  Diagnosis:  Final diagnoses:  Adjustment disorder with mixed anxiety and depressed mood    History of Present illness: Rebecca Arnold is a 37 y.o. female patient with a past psychiatric history significant for MDD, GAD, and ADHD who presents to the [facility] unaccompanied voluntary with a chief complaints of worsening depression and anxiety.   Patent is alert and oriented x 4. Her thought process is logical and speech is clear and coherent. Her mood is depressed and affect is congruent. She denies SI/HI/AVH. No psychosis on exam. She reports worsening depression for the past 4-5 months. She describes her depressive symptoms as feelings of sadness, crying spells, hopelessness, worthlessness, irritability, isolating and decreased motivation everyday. She rates her depression 8/10 with 10 being the worst. She identifies current stressors as recent separation from husband in October 2023,  recently laid off from work, and a shared custody schedule with ex-husband which limits her ability to see her 2 children (72 & 66 y.o). She reports worsening anxiety for a while. She describes her  anxiety symptoms as palpitations, excessive worrying, ruminating, and impending doom. She reports difficulty sleeping at night due to racing thoughts. She reports sleeping for 4 hours, staying awake for 3 to 4 hours and then falling back to sleep. She states that she's been taking Effexor 150 mg daily for the past year, prescribed by her PCP at [facility] for depression and anxiety. She denies past or present outpatient therapy or psychiatry. She reports limited support due to her brother living out of town, issues with family members and friends not being understanding. She resides alone with her two children. She  denies access to weapons. She works 3 part-time jobs. She denies past suicide attempts. She denies past inpatient psych hospitalizations. She denies family psychiatric history. She reports occasional marijuana and alcohol use.   [place] ED from 08/15/2022 in [place] No Risk       Psychiatric Specialty Exam  Presentation  General Appearance:Appropriate for Environment  Eye Contact:Fair  Speech:Clear and Coherent  Speech Volume:Normal  Handedness:Right   Mood and Affect  Mood: Depressed; Anxious  Affect: Congruent   Thought Process  Thought Processes: Coherent; Goal Directed  Descriptions of Associations:Intact  Orientation:Full (Time, Place and Person)  Thought Content:Logical    Hallucinations:None  Ideas of Reference:None  Suicidal Thoughts:No  Homicidal Thoughts:No   Sensorium  Memory: Immediate Fair; Recent Fair; Remote Fair  Judgment: Fair  Insight: Fair   Community education officer  Concentration: Fair  Attention Span: Fair  Recall: [employer] of Knowledge: Fair  Language: Fair   Psychomotor Activity  Psychomotor Activity: Normal   Assets  Assets: Armed forces logistics/support/administrative officer; Desire for Improvement; Financial Resources/Insurance; Housing; Leisure Time; Physical Health; Transportation   Sleep  Sleep: Poor  Number of hours:  4   Physical Exam: Physical Exam Constitutional:      Appearance: Normal appearance.  HENT:     Head: Normocephalic.     Nose: Nose normal.  Eyes:     Conjunctiva/sclera: Conjunctivae normal.  Cardiovascular:     Rate and Rhythm: Normal rate.  Pulmonary:     Effort: Pulmonary effort is normal.  Musculoskeletal:        General: Normal range of motion.     Cervical back: Normal range  of motion.  Neurological:     Mental Status: She is alert and oriented to person, place, and time.    Review of Systems  Constitutional: Negative.   HENT:  Negative.    Gastrointestinal: Negative.   Genitourinary: Negative.   Musculoskeletal: Negative.   Neurological: Negative.   Endo/Heme/Allergies: Negative.   Psychiatric/Behavioral:  Positive for depression. The patient is nervous/anxious and has insomnia.    Blood pressure (!) 147/95, pulse 96, temperature 98.2 F (36.8 C), resp. rate 18, SpO2 98 %, unknown if currently breastfeeding. There is no height or weight on file to calculate BMI.  Musculoskeletal: Strength & Muscle Tone: within normal limits Gait & Station: normal Patient leans: N/A   [place] MSE Discharge Disposition for Follow up and Recommendations: Based on my evaluation the patient does not appear to have an emergency medical condition and can be discharged with resources and follow up care in outpatient services for Medication Management, Individual Therapy, and Group Therapy  Discharge recommendations:   Medications: Patient is to take medications as prescribed. The patient or patient's guardian is to contact a medical professional and/or outpatient provider to address any new side effects that develop. The patient or the patient's guardian should update outpatient providers of any new medications and/or medication changes.   Take hydroxyzine 25 mg by mouth 3 times per day as needed for anxiety and 2 tablets (50 mg) at bedtime for sleep.   Take Effexor 150 mg by mouth daily for depression.   Outpatient Follow up: Please review list of outpatient resources for psychiatry and counseling. Please follow up with your primary care provider for all medical related needs.   Therapy: We recommend that patient participate in individual therapy to address mental health concerns.  Safety:   The following safety precautions should be taken:   No sharp objects. This includes scissors, razors, scrapers, and putty knives.   Chemicals should be removed and locked up.   Medications should be removed and locked up.   Weapons  should be removed and locked up. This includes firearms, knives and instruments that can be used to cause injury.   The patient should abstain from use of illicit substances/drugs and abuse of any medications.  If symptoms worsen or do not continue to improve or if the patient becomes actively suicidal or homicidal then it is recommended that the patient return to the closest hospital emergency department, the [facility], or call 911 for further evaluation and treatment. National Suicide Prevention Lifeline 1-800-SUICIDE or (407)129-1984.  About 988 988 offers 24/7 access to trained crisis counselors who can help people experiencing mental health-related distress. People can call or text 988 or chat 988lifeline.org for themselves or if they are worried about a loved one who may need crisis support.   Please contact one of the following facilities to start medication management and therapy services:   [facility] [address]  [place], [place] [address] [phone]   [place]  [address] [place], [place] [address] [phone]  [place]  [address] Ignacia Marvel [place], [place] [address] [phone]  [facility]  [address]  [place], [place] [address] [phone]  [facility]  [place], [place] [address] [phone]  [place]  [address] Jarrett Ables  [place], [place] [address] [phone]  Uninsured: You  are encouraged to follow up with [facility] for outpatient treatment.  Walk in/ Open Access Hours: Monday - Friday 8AM - 11AM (To see provider and therapist) Friday - [place] (To see therapist only)  [facility], [place]   Marissa Calamity, NP 08/15/2022, 12:43 PM

## 2022-08-15 NOTE — Progress Notes (Signed)
  Rebecca Arnold ROUTINE:  is a15 year old femal who presents voluntarily at [facility] due to depressive symtpms. Patient is experiencing sadness, crying spells daily. She has beguns isolatintg and has diffeictultty sleeping. She denies SI/HI/AVH. Client is currently  being prescribed Effexor for her depression,  In addition patient had a  diagnosis of ADHD.  Patient reports having an occasional drink in social settings usually less than once a month.  Client acknowledges smoking marijuana  was vague in amount. Patient was alert and oriented but tearful and and depressed and cooperative.    08/15/22 1205  [place] (Walk-ins at [facility] only)  How Did You Hear About [place]? Self  What Is the Reason for Your Visit/Call Today? Pt. is a62 year old femal who presents voluntarily at [facility] due to depressive symtpms.  Patient  is experiencing sadness, crying spells daily.  She has beguns isolatintg and has diffeictultty sleeping.  She denies SI/HI/AVH.  How Long Has This Been Causing You Problems? > than 6 months  Have You Recently Had Any Thoughts About Hurting Yourself? No  Are You Planning to Commit Suicide/Harm Yourself At This time? No  Have you Recently Had Thoughts About [place]? No  Are You Planning To Harm Someone At This Time? No  Are you currently experiencing any auditory, visual or other hallucinations? No  Have You Used Any Alcohol or Drugs in the Past 24 Hours? No  Do you have any current medical co-morbidities that require immediate attention? No  Clinician description of patient physical appearance/behavior: Client was depressed and tearful.  What Do You Feel Would Help You the Most Today? Treatment for Depression or other mood problem  If access to [facility] Urgent Care was not available, would you have sought care in the Emergency Department? No  Determination of Need Routine (7 days)  Options For Referral Outpatient Therapy;Medication Management

## 2022-08-15 NOTE — Discharge Instructions (Addendum)
Discharge recommendations:   Medications: Patient is to take medications as prescribed. The patient or patient's guardian is to contact a medical professional and/or outpatient provider to address any new side effects that develop. The patient or the patient's guardian should update outpatient providers of any new medications and/or medication changes.   Take hydroxyzine 25 mg by mouth 3 times per day as needed for anxiety and 2 tablets (50 mg) at bedtime for sleep.   Take Effexor 150 mg by mouth daily for depression.   Outpatient Follow up: Please review list of outpatient resources for psychiatry and counseling. Please follow up with your primary care provider for all medical related needs.   Therapy: We recommend that patient participate in individual therapy to address mental health concerns.  Safety:   The following safety precautions should be taken:   No sharp objects. This includes scissors, razors, scrapers, and putty knives.   Chemicals should be removed and locked up.   Medications should be removed and locked up.   Weapons should be removed and locked up. This includes firearms, knives and instruments that can be used to cause injury.   The patient should abstain from use of illicit substances/drugs and abuse of any medications.  If symptoms worsen or do not continue to improve or if the patient becomes actively suicidal or homicidal then it is recommended that the patient return to the closest hospital emergency department, the [facility], or call 911 for further evaluation and treatment. National Suicide Prevention Lifeline 1-800-SUICIDE or (208)051-5593.  About 988 988 offers 24/7 access to trained crisis counselors who can help people experiencing mental health-related distress. People can call or text 988 or chat 988lifeline.org for themselves or if they are worried about a loved one who may need crisis support.   Please contact one of  the following facilities to start medication management and therapy services:   [facility] [id number]  [place], [place] [address] [phone]   [facility]  [address] [place], [place] [address] [phone]  [facility]  [address] [place], [place] [address] [phone]  [facility]  [address]  [place], [place] [address] [phone]  [facility]  [place], [place] [address] [phone]  [facility]  [address] [place]  [place],  [address] [phone]  Uninsured: You are encouraged to follow up with [facility] for outpatient treatment.  Walk in/ Open Access Hours: Monday - Friday 8AM - 11AM (To see provider and therapist) Friday - [place] (To see therapist only)  [facility] [address] [place], [place]

## 2023-05-27 ENCOUNTER — Telehealth: Payer: Self-pay

## 2023-05-27 NOTE — Telephone Encounter (Addendum)
Patient returned call, stated she has history of abnormal pap smear (HGSIL, CIN-3), gotten off  track with pap smears, changed jobs, no longer has pap smears. Patient was scheduled for pap smear (06/11/2023 @ 8:15 am per Fonnie Mu, RN).      Left patient a message on voicemail requesting a return call. Attempted to contact patient regarding pap smear.

## 2023-06-11 ENCOUNTER — Other Ambulatory Visit (HOSPITAL_COMMUNITY)
Admission: RE | Admit: 2023-06-11 | Discharge: 2023-06-11 | Disposition: A | Discharge status: Home / Self Care | Payer: BC Managed Care – PPO | Source: Ambulatory Visit | Attending: Obstetrics and Gynecology | Admitting: Obstetrics and Gynecology

## 2023-06-11 ENCOUNTER — Other Ambulatory Visit: Payer: BC Managed Care – PPO | Admitting: Hematology and Oncology

## 2023-06-11 DIAGNOSIS — Z124 Encounter for screening for malignant neoplasm of cervix: Secondary | ICD-10-CM | POA: Insufficient documentation

## 2023-06-11 NOTE — Progress Notes (Signed)
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 If abnormal, will repeat colposcopy.      Patient's History There are no problems to display for this patient.  No past medical history on file.  No family history on file.  Social History   Occupational History   Not on file  Tobacco Use   Smoking status: Former   Smokeless tobacco: Never  Substance and Sexual Activity   Alcohol use: Yes    Comment: occasional   Drug use: No   Sexual activity: Not on file

## 2023-06-18 LAB — CYTOLOGY - PAP
Comment: NEGATIVE
Diagnosis: NEGATIVE
High risk HPV: NEGATIVE

## 2023-09-16 ENCOUNTER — Encounter (HOSPITAL_COMMUNITY): Payer: Self-pay | Admitting: Emergency Medicine

## 2023-09-16 ENCOUNTER — Ambulatory Visit (HOSPITAL_COMMUNITY)
Admission: EM | Admit: 2023-09-16 | Discharge: 2023-09-16 | Disposition: A | Discharge status: Home / Self Care | Attending: Nurse Practitioner | Admitting: Nurse Practitioner

## 2023-09-16 ENCOUNTER — Other Ambulatory Visit: Payer: Self-pay

## 2023-09-16 ENCOUNTER — Ambulatory Visit (INDEPENDENT_AMBULATORY_CARE_PROVIDER_SITE_OTHER)

## 2023-09-16 DIAGNOSIS — J22 Unspecified acute lower respiratory infection: Secondary | ICD-10-CM | POA: Diagnosis not present

## 2023-09-16 DIAGNOSIS — R052 Subacute cough: Secondary | ICD-10-CM

## 2023-09-16 DIAGNOSIS — J019 Acute sinusitis, unspecified: Secondary | ICD-10-CM | POA: Diagnosis not present

## 2023-09-16 DIAGNOSIS — Z3202 Encounter for pregnancy test, result negative: Secondary | ICD-10-CM | POA: Diagnosis not present

## 2023-09-16 LAB — POCT URINE PREGNANCY: Preg Test, Ur: NEGATIVE

## 2023-09-16 MED ORDER — DOXYCYCLINE HYCLATE 100 MG PO CAPS: 100.0000 mg | ORAL_CAPSULE | Freq: Two times a day (BID) | ORAL | 0 refills | Status: AC

## 2023-09-16 MED ORDER — BENZONATATE 100 MG PO CAPS: 100.0000 mg | ORAL_CAPSULE | Freq: Three times a day (TID) | ORAL | 0 refills | Status: DC | PRN

## 2023-09-16 MED ORDER — PREDNISONE 20 MG PO TABS: 20.0000 mg | ORAL_TABLET | Freq: Every day | ORAL | 0 refills | Status: AC

## 2023-09-16 NOTE — Discharge Instructions (Signed)
 You are being treated today for a bacterial sinus and lower respiratory infection.  Take the doxycycline and prednisone as prescribed.  In addition, recommend guaifenesin 600 mg twice daily to help with congestion and make sure you are drinking plenty of water.  You can take Tylenol or ibuprofen as needed for fever or pain and the cough suppressant medication (tessalon) every 8 hours as needed for cough.  As we discussed, recommend follow up in approximately 6 weeks with PCP or here in urgent care to ensure lung imaging does not show any significant changes.

## 2023-09-16 NOTE — ED Provider Notes (Signed)
 [facility]    CSN: [id number] Arrival date & time: 09/16/23  4696      History   Chief Complaint Chief Complaint  Patient presents with   Cough    HPI Rebecca Arnold is a 38 y.o. female.   Patient presents today with 2-3 day history of mid low back pain, congested cough, chest and nasal congestion, sinus pressure, headache, ears popping, decreased appetite, and fatigue.  No body aches, chills, nausea/vomiting, or diarrhea.  She reports she was sick approximately 6 weeks ago with similar symptoms that improved a little bit but the cough never fully went away.  She never stopped coughing up mucus.  She reports her rib cage is tender to touch from coughing so much.  Also reports it is hard to take a deep breath in.  Denies history of asthma or chronic lung disease however had inhalers a lot as a child for "bronchitis" and recently stopped vaping a couple of months ago.  Has tried Muciex and Dayquil for symptoms with mild temporary relief.    History reviewed. No pertinent past medical history.  There are no active problems to display for this patient.   History reviewed. No pertinent surgical history.  OB History     Gravida  1   Para      Term      Preterm      AB      Living         SAB      IAB      Ectopic      Multiple      Live Births               Home Medications    Prior to Admission medications   Medication Sig Start Date End Date Taking? Authorizing Provider  benzonatate (TESSALON) 100 MG capsule Take 1 capsule (100 mg total) by mouth 3 (three) times daily as needed for cough. Do not take with alcohol or while operating or driving heavy machinery 2/95/28  Yes Cathlean Marseilles A, NP  doxycycline (VIBRAMYCIN) 100 MG capsule Take 1 capsule (100 mg total) by mouth 2 (two) times daily for 7 days. 09/16/23 09/23/23 Yes Valentino Nose, NP  predniSONE (DELTASONE) 20 MG tablet Take 1 tablet (20 mg total) by mouth daily with  breakfast for 5 days. 09/16/23 09/21/23 Yes Valentino Nose, NP  hydrOXYzine (VISTARIL) 25 MG capsule Take 1 capsule (25 mg total) by mouth 3 (three) times daily as needed for anxiety. May take 2 tablets (50 mg) at bedtime for sleep. 08/15/22   White, Patrice L, NP  venlafaxine XR (EFFEXOR XR) 150 MG 24 hr capsule Take 1 capsule (150 mg total) by mouth daily. 08/15/22 09/14/22  Layla Barter, NP    Family History History reviewed. No pertinent family history.  Social History Social History   Tobacco Use   Smoking status: Former   Smokeless tobacco: Never  Vaping Use   Vaping status: Never Used  Substance Use Topics   Alcohol use: Yes    Comment: occasional   Drug use: No     Allergies   Penicillins   Review of Systems Review of Systems Per HPI  Physical Exam Triage Vital Signs ED Triage Vitals  Encounter Vitals Group     BP 09/16/23 1058 (!) 136/90     Systolic BP Percentile --      Diastolic BP Percentile --      Pulse Rate 09/16/23  1058 88     Resp 09/16/23 1058 18     Temp 09/16/23 1058 98.2 F (36.8 C)     Temp Source 09/16/23 1058 Oral     SpO2 09/16/23 1058 96 %     Weight --      Height --      Head Circumference --      Peak Flow --      Pain Score 09/16/23 1053 7     Pain Loc --      Pain Education --      Exclude from Growth Chart --    No data found.  Updated Vital Signs BP (!) 136/90 (BP Location: Right Arm) Comment (BP Location): large cuff  Pulse 88   Temp 98.2 F (36.8 C) (Oral)   Resp 18   LMP 08/20/2023 (Exact Date)   SpO2 96%   Visual Acuity Right Eye Distance:   Left Eye Distance:   Bilateral Distance:    Right Eye Near:   Left Eye Near:    Bilateral Near:     Physical Exam Vitals and nursing note reviewed.  Constitutional:      General: She is not in acute distress.    Appearance: Normal appearance. She is ill-appearing. She is not toxic-appearing.  HENT:     Head: Normocephalic and atraumatic.     Right Ear:  Tympanic membrane, ear canal and external ear normal.     Left Ear: Tympanic membrane, ear canal and external ear normal.     Nose: Congestion present. No rhinorrhea.     Mouth/Throat:     Mouth: Mucous membranes are moist.     Pharynx: Oropharynx is clear. Posterior oropharyngeal erythema present. No oropharyngeal exudate.  Eyes:     General: No scleral icterus.    Extraocular Movements: Extraocular movements intact.  Cardiovascular:     Rate and Rhythm: Normal rate and regular rhythm.  Pulmonary:     Effort: Pulmonary effort is normal. No respiratory distress.     Breath sounds: Wheezing (faint expiratory wheeze with cough) present. No rhonchi or rales.  Abdominal:     General: Abdomen is flat. Bowel sounds are normal. There is no distension.     Palpations: Abdomen is soft.     Tenderness: There is no abdominal tenderness. There is no right CVA tenderness, left CVA tenderness, guarding or rebound.  Musculoskeletal:     Cervical back: Normal range of motion and neck supple.  Lymphadenopathy:     Cervical: No cervical adenopathy.  Skin:    General: Skin is warm and dry.     Coloration: Skin is not jaundiced or pale.     Findings: No erythema or rash.  Neurological:     Mental Status: She is alert and oriented to person, place, and time.  Psychiatric:        Behavior: Behavior is cooperative.      UC Treatments / Results  Labs (all labs ordered are listed, but only abnormal results are displayed) Labs Reviewed  POCT URINE PREGNANCY    EKG   Radiology DG Chest 2 View Result Date: 09/16/2023 CLINICAL DATA:  Cough for 1 month. EXAM: CHEST - 2 VIEW COMPARISON:  September 19, 2007. FINDINGS: The heart size and mediastinal contours are within normal limits. Both lungs are clear. The visualized skeletal structures are unremarkable. IMPRESSION: No active cardiopulmonary disease. Electronically Signed   By: Lupita Raider M.D.   On: 09/16/2023 12:02    Procedures Procedures  (  including critical care time)  Medications Ordered in UC Medications - No data to display  Initial Impression / Assessment and Plan / UC Course  I have reviewed the triage vital signs and the nursing notes.  Pertinent labs & imaging results that were available during my care of the patient were reviewed by me and considered in my medical decision making (see chart for details).   Patient is well-appearing, normotensive, afebrile, not tachycardic, not tachypneic, oxygenating well on room air.    1. Subacute cough 2. Acute lower respiratory infection 3. Acute non-recurrent sinusitis, unspecified location Overall, vital signs and examination are reassuring today Chest xray is read as negative, however I am concerned about a circular area in the left upper lung; I recommended close follow up with PCP and repeat imaging in 6 weeks even if symptoms improve Given length of symptoms, will treat patient for lower respiratory infection with doxycycline twice daily for 7 days In addition, start low dose oral prednisone to help with lung inflammation Start cough suppressant medication Return and ER precautions discussed with patient Work excuse provided  4. Urine pregnancy test negative  The patient was given the opportunity to ask questions.  All questions answered to their satisfaction.  The patient is in agreement to this plan.    Final Clinical Impressions(s) / UC Diagnoses   Final diagnoses:  Subacute cough  Acute lower respiratory infection  Acute non-recurrent sinusitis, unspecified location  Urine pregnancy test negative     Discharge Instructions      You are being treated today for a bacterial sinus and lower respiratory infection.  Take the doxycycline and prednisone as prescribed.  In addition, recommend guaifenesin 600 mg twice daily to help with congestion and make sure you are drinking plenty of water.  You can take Tylenol or ibuprofen as needed for fever or pain and the  cough suppressant medication (tessalon) every 8 hours as needed for cough.  As we discussed, recommend follow up in approximately 6 weeks with PCP or here in urgent care to ensure lung imaging does not show any significant changes.     ED Prescriptions     Medication Sig Dispense Auth. Provider   doxycycline (VIBRAMYCIN) 100 MG capsule Take 1 capsule (100 mg total) by mouth 2 (two) times daily for 7 days. 14 capsule Cathlean Marseilles A, NP   benzonatate (TESSALON) 100 MG capsule Take 1 capsule (100 mg total) by mouth 3 (three) times daily as needed for cough. Do not take with alcohol or while operating or driving heavy machinery 21 capsule Cathlean Marseilles A, NP   predniSONE (DELTASONE) 20 MG tablet Take 1 tablet (20 mg total) by mouth daily with breakfast for 5 days. 5 tablet Valentino Nose, NP      PDMP not reviewed this encounter.   Valentino Nose, NP 09/16/23 [phone]

## 2023-09-16 NOTE — ED Triage Notes (Signed)
 Cough, chest congestion for a month.    Reports a "cold" in early February. Cough has lingered since then.  In the past week has developed a productive cough, lower back pain and soreness under ribs, headache, feverish, tender neck, face pain and head congestion and chest congestion.    Has taken mucinex, dayquil.

## 2023-10-29 ENCOUNTER — Encounter (HOSPITAL_COMMUNITY): Payer: Self-pay

## 2023-10-29 ENCOUNTER — Ambulatory Visit (HOSPITAL_COMMUNITY)
Admission: RE | Admit: 2023-10-29 | Discharge: 2023-10-29 | Disposition: A | Discharge status: Home / Self Care | Payer: Self-pay | Source: Ambulatory Visit | Attending: Family Medicine | Admitting: Family Medicine

## 2023-10-29 VITALS — BP 142/98 | HR 104 | Temp 98.9°F | Resp 16

## 2023-10-29 DIAGNOSIS — J029 Acute pharyngitis, unspecified: Secondary | ICD-10-CM

## 2023-10-29 DIAGNOSIS — R051 Acute cough: Secondary | ICD-10-CM

## 2023-10-29 DIAGNOSIS — J020 Streptococcal pharyngitis: Secondary | ICD-10-CM

## 2023-10-29 HISTORY — DX: Depression, unspecified: F32.A

## 2023-10-29 HISTORY — DX: Anxiety disorder, unspecified: F41.9

## 2023-10-29 LAB — POCT RAPID STREP A (OFFICE): Rapid Strep A Screen: POSITIVE — AB

## 2023-10-29 MED ORDER — CEPHALEXIN 500 MG PO CAPS: 1000.0000 mg | ORAL_CAPSULE | Freq: Two times a day (BID) | ORAL | 0 refills | Status: AC

## 2023-10-29 NOTE — ED Provider Notes (Signed)
 [facility]    CSN: [id number] Arrival date & time: 10/29/23  7829      History   Chief Complaint Chief Complaint  Patient presents with   Cough    Persistent cough since February with mucus, lymph nodes swollen, otc medications no longer working. Hard to swallow - Entered by patient   Adenopathy    HPI Rebecca Arnold is a 38 y.o. female.   Patient reports that she was seen on 09/16/2023 and diagnosed with viral upper respiratory infection versus bronchitis.  She was treated with doxycycline  100 mg twice daily for 7 days, benzonatate  every 8 hours as needed for cough or congestion and prednisone  20 mg daily for 6 days.  She feels like a lot of her symptoms improved but she reports that the cough never really went away and she still feels like she has some mild chest congestion.  She is mostly concerned about the sore throat and swollen lymph nodes.  She noticed the swollen lymph nodes approximately 10/21/2023.   Cough Associated symptoms: rhinorrhea and sore throat   Associated symptoms: no chest pain, no chills, no ear pain, no fever, no rash and no shortness of breath     Past Medical History:  Diagnosis Date   Anxiety    Depression     There are no active problems to display for this patient.   History reviewed. No pertinent surgical history.  OB History     Gravida  1   Para      Term      Preterm      AB      Living         SAB      IAB      Ectopic      Multiple      Live Births               Home Medications    Prior to Admission medications   Medication Sig Start Date End Date Taking? Authorizing Provider  cephALEXin  (KEFLEX ) 500 MG capsule Take 2 capsules (1,000 mg total) by mouth 2 (two) times daily for 7 days. 10/29/23 11/05/23 Yes Guss Legacy, FNP  venlafaxine  XR (EFFEXOR  XR) 150 MG 24 hr capsule Take 1 capsule (150 mg total) by mouth daily. 08/15/22 09/14/22  Gwenlyn Lento, NP    Family History History  reviewed. No pertinent family history.  Social History Social History   Tobacco Use   Smoking status: Former    Types: Cigarettes   Smokeless tobacco: Never  Vaping Use   Vaping status: Never Used  Substance Use Topics   Alcohol use: Yes    Comment: occasional   Drug use: No     Allergies   Penicillins   Review of Systems Review of Systems  Constitutional:  Negative for chills and fever.  HENT:  Positive for congestion, postnasal drip, rhinorrhea and sore throat. Negative for ear pain.   Eyes:  Negative for pain and visual disturbance.  Respiratory:  Positive for cough. Negative for shortness of breath.   Cardiovascular:  Negative for chest pain and palpitations.  Gastrointestinal:  Negative for abdominal pain, constipation, diarrhea, nausea and vomiting.  Genitourinary:  Negative for dysuria and hematuria.  Musculoskeletal:  Negative for arthralgias and back pain.  Skin:  Negative for color change and rash.  Neurological:  Negative for seizures and syncope.  Hematological:  Positive for adenopathy.  All other systems reviewed and are negative.  Physical Exam Triage Vital Signs ED Triage Vitals [10/29/23 1911]  Encounter Vitals Group     BP (!) 142/98     Systolic BP Percentile      Diastolic BP Percentile      Pulse Rate (!) 104     Resp 16     Temp 98.9 F (37.2 C)     Temp Source Oral     SpO2 97 %     Weight      Height      Head Circumference      Peak Flow      Pain Score 0     Pain Loc      Pain Education      Exclude from Growth Chart    No data found.  Updated Vital Signs BP (!) 142/98 (BP Location: Right Arm)   Pulse (!) 104   Temp 98.9 F (37.2 C) (Oral)   Resp 16   LMP 10/22/2023 (Approximate)   SpO2 97%   Visual Acuity Right Eye Distance:   Left Eye Distance:   Bilateral Distance:    Right Eye Near:   Left Eye Near:    Bilateral Near:     Physical Exam Vitals and nursing note reviewed.  Constitutional:      General:  She is not in acute distress.    Appearance: She is well-developed. She is not ill-appearing or toxic-appearing.  HENT:     Head: Normocephalic and atraumatic.     Right Ear: Hearing, tympanic membrane, ear canal and external ear normal.     Left Ear: Hearing, tympanic membrane, ear canal and external ear normal.     Nose: Congestion and rhinorrhea present. Rhinorrhea is clear.     Right Sinus: No maxillary sinus tenderness or frontal sinus tenderness.     Left Sinus: No maxillary sinus tenderness or frontal sinus tenderness.     Mouth/Throat:     Lips: Pink.     Mouth: Mucous membranes are moist.     Pharynx: Uvula midline. Posterior oropharyngeal erythema present. No oropharyngeal exudate.     Tonsils: No tonsillar exudate (No exudate but the tonsils are erythematous and enlarged.).  Eyes:     Conjunctiva/sclera: Conjunctivae normal.     Pupils: Pupils are equal, round, and reactive to light.  Cardiovascular:     Rate and Rhythm: Normal rate and regular rhythm.     Heart sounds: S1 normal and S2 normal. No murmur heard. Pulmonary:     Effort: Pulmonary effort is normal. No respiratory distress.     Breath sounds: Normal breath sounds. No decreased breath sounds, wheezing, rhonchi or rales.  Abdominal:     General: Bowel sounds are normal.     Palpations: Abdomen is soft.     Tenderness: There is no abdominal tenderness.  Musculoskeletal:        General: No swelling.     Cervical back: Neck supple.  Lymphadenopathy:     Head:     Right side of head: No submental, submandibular, tonsillar, preauricular or posterior auricular adenopathy.     Left side of head: No submental, submandibular, tonsillar, preauricular or posterior auricular adenopathy.     Cervical: Cervical adenopathy present.     Right cervical: Superficial cervical adenopathy present.     Left cervical: Superficial cervical adenopathy present.  Skin:    General: Skin is warm and dry.     Capillary Refill: Capillary  refill takes less than 2 seconds.  Findings: No rash.  Neurological:     Mental Status: She is alert and oriented to person, place, and time.  Psychiatric:        Mood and Affect: Mood normal.      UC Treatments / Results  Labs (all labs ordered are listed, but only abnormal results are displayed) Labs Reviewed  POCT RAPID STREP A (OFFICE) - Abnormal; Notable for the following components:      Result Value   Rapid Strep A Screen Positive (*)    All other components within normal limits    EKG   Radiology No results found.  Procedures Procedures (including critical care time)  Medications Ordered in UC Medications - No data to display  Initial Impression / Assessment and Plan / UC Course  I have reviewed the triage vital signs and the nursing notes.  Pertinent labs & imaging results that were available during my care of the patient were reviewed by me and considered in my medical decision making (see chart for details).     Plan of Care: Strep throat with secondary sore throat and cough: Rapid strep was positive.  Patient has a lot of secondary lymphadenopathy due to the strep throat.  Cephalexin , 500 mg, 2 pills twice daily for 7 days.  Given a good Rx coupon to help the medication be more affordable.  Get plenty of fluids and rest.  Use acetaminophen  or ibuprofen  as needed for pain.    Follow-up if symptoms do not improve, worsen or new symptoms occur. Final Clinical Impressions(s) / UC Diagnoses   Final diagnoses:  Acute cough  Sore throat  Streptococcal sore throat     Discharge Instructions      Positive for acute Streptococcus A.  Encourage comfort measures such as IC beverages, popsicles, warm broth or any foods that help soothe her throat.  Use the seat Aminofen on or ibuprofen  every 4 hours as directed on the package for throat pain or pain in the neck with her lymph nodes.  Will treat with cephalexin , 500 mg, 2 pills twice a day for 7 days.  Provided  a GoodRx coupon to help with the cough.  Advised that she is contagious until she has been on the antibiotics for 1 to 2 days.  Get plenty of fluids and rest.  Follow-up if symptoms do not improve, worsen or new symptoms occur.     ED Prescriptions     Medication Sig Dispense Auth. Provider   cephALEXin  (KEFLEX ) 500 MG capsule Take 2 capsules (1,000 mg total) by mouth 2 (two) times daily for 7 days. 28 capsule Guss Legacy, FNP      PDMP not reviewed this encounter.   Guss Legacy, FNP 10/29/23 2015

## 2023-10-29 NOTE — ED Triage Notes (Signed)
 Patient reports that she was seen in March for a cough that she had had for one month and was prescribed antibiotics. Today, the patient states she is still having a green productive cough. And has had swollen lymph nodes x 1 week as well.

## 2023-10-29 NOTE — Discharge Instructions (Addendum)
 Positive for acute Streptococcus A.  Encourage comfort measures such as IC beverages, popsicles, warm broth or any foods that help soothe her throat.  Use the seat Aminofen on or ibuprofen  every 4 hours as directed on the package for throat pain or pain in the neck with her lymph nodes.  Will treat with cephalexin , 500 mg, 2 pills twice a day for 7 days.  Provided a GoodRx coupon to help with the cough.  Advised that she is contagious until she has been on the antibiotics for 1 to 2 days.  Get plenty of fluids and rest.  Follow-up if symptoms do not improve, worsen or new symptoms occur.

## 2024-02-14 ENCOUNTER — Ambulatory Visit (HOSPITAL_COMMUNITY)
Admission: EM | Admit: 2024-02-14 | Discharge: 2024-02-14 | Disposition: A | Discharge status: Home / Self Care | Payer: Self-pay | Attending: Emergency Medicine | Admitting: Emergency Medicine

## 2024-02-14 ENCOUNTER — Encounter (HOSPITAL_COMMUNITY): Payer: Self-pay | Admitting: Emergency Medicine

## 2024-02-14 DIAGNOSIS — L239 Allergic contact dermatitis, unspecified cause: Secondary | ICD-10-CM

## 2024-02-14 MED ORDER — TRIAMCINOLONE ACETONIDE 0.1 % EX CREA: 1.0000 | TOPICAL_CREAM | Freq: Two times a day (BID) | CUTANEOUS | 0 refills | Status: DC

## 2024-02-14 NOTE — Discharge Instructions (Signed)
 Apply triamcinolone  cream twice daily to the area. Continue taking a daily allergy medication to help with this. Follow-up with your primary care doctor or return here as needed.

## 2024-02-14 NOTE — ED Provider Notes (Signed)
 [facility]    CSN: [id number] Arrival date & time: 02/14/24  9183      History   Chief Complaint Chief Complaint  Patient presents with   Rash    HPI Rebecca Arnold is a 38 y.o. female.   Patient presents with itchy rash to bilateral abdomen that she noticed upon waking this morning.  Patient denies any new detergents, soaps, lotions, or products that she can think of.  Patient reports that she was concerned because [place] and thought maybe she developed a rash from something there, patient reports that she did inspect her child and they did not have any rash.  Denies pain or fever.  Patient denies taking any medication or applying creams to help with the rash.  The history is provided by the patient and medical records.  Rash   Past Medical History:  Diagnosis Date   Anxiety    Depression     There are no active problems to display for this patient.   History reviewed. No pertinent surgical history.  OB History     Gravida  1   Para      Term      Preterm      AB      Living         SAB      IAB      Ectopic      Multiple      Live Births               Home Medications    Prior to Admission medications   Medication Sig Start Date End Date Taking? Authorizing Provider  triamcinolone  cream (KENALOG ) 0.1 % Apply 1 Application topically 2 (two) times daily. 02/14/24  Yes Johnie Rumaldo LABOR, NP    Family History No family history on file.  Social History Social History   Tobacco Use   Smoking status: Former    Types: Cigarettes   Smokeless tobacco: Never  Vaping Use   Vaping status: Never Used  Substance Use Topics   Alcohol use: Yes    Comment: occasional   Drug use: No     Allergies   Penicillins   Review of Systems Review of Systems  Skin:  Positive for rash.   Per HPI  Physical Exam Triage Vital Signs ED Triage Vitals  Encounter Vitals Group     BP 02/14/24 0854  119/84     Girls Systolic BP Percentile --      Girls Diastolic BP Percentile --      Boys Systolic BP Percentile --      Boys Diastolic BP Percentile --      Pulse Rate 02/14/24 0854 81     Resp 02/14/24 0854 16     Temp 02/14/24 0854 98.7 F (37.1 C)     Temp Source 02/14/24 0854 Oral     SpO2 02/14/24 0854 98 %     Weight --      Height --      Head Circumference --      Peak Flow --      Pain Score 02/14/24 0853 0     Pain Loc --      Pain Education --      Exclude from Growth Chart --    No data found.  Updated Vital Signs BP 119/84 (BP Location: Right Arm)   Pulse 81   Temp 98.7 F (37.1 C) (  Oral)   Resp 16   LMP 01/20/2024 (Exact Date)   SpO2 98%   Visual Acuity Right Eye Distance:   Left Eye Distance:   Bilateral Distance:    Right Eye Near:   Left Eye Near:    Bilateral Near:     Physical Exam Vitals and nursing note reviewed.  Constitutional:      General: She is awake. She is not in acute distress.    Appearance: Normal appearance. She is well-developed and well-groomed. She is not ill-appearing.  Abdominal:   Skin:    Findings: Rash present. Rash is macular.     Comments: Linear macular rash noted to bilateral lower abdomen.  Neurological:     Mental Status: She is alert.  Psychiatric:        Behavior: Behavior is cooperative.      UC Treatments / Results  Labs (all labs ordered are listed, but only abnormal results are displayed) Labs Reviewed - No data to display  EKG   Radiology No results found.  Procedures Procedures (including critical care time)  Medications Ordered in UC Medications - No data to display  Initial Impression / Assessment and Plan / UC Course  I have reviewed the triage vital signs and the nursing notes.  Pertinent labs & imaging results that were available during my care of the patient were reviewed by me and considered in my medical decision making (see chart for details).     Patient is overall  well-appearing.  Vitals are stable.  Exam findings consistent with contact dermatitis.  Prescribed triamcinolone .  Discussed taking daily allergy medication to help with this.  Discussed follow-up and return precautions. Final Clinical Impressions(s) / UC Diagnoses   Final diagnoses:  Allergic contact dermatitis, unspecified trigger     Discharge Instructions      Apply triamcinolone  cream twice daily to the area. Continue taking a daily allergy medication to help with this. Follow-up with your primary care doctor or return here as needed.   ED Prescriptions     Medication Sig Dispense Auth. Provider   triamcinolone  cream (KENALOG ) 0.1 % Apply 1 Application topically 2 (two) times daily. 30 g Johnie Flaming A, NP      PDMP not reviewed this encounter.   Johnie Flaming A, NP 02/14/24 [phone]

## 2024-02-14 NOTE — ED Triage Notes (Signed)
 Pt reports woke up today with rash on bilateral sides on abdomen. Pt reports itching. Denies any new soaps, lotions, detergents, etc. She was the only one house that had it.  Pt denies taking or using any creams to help with rash.

## 2024-03-06 ENCOUNTER — Emergency Department (HOSPITAL_COMMUNITY): Payer: Self-pay

## 2024-03-06 ENCOUNTER — Other Ambulatory Visit: Payer: Self-pay

## 2024-03-06 ENCOUNTER — Encounter (HOSPITAL_COMMUNITY): Payer: Self-pay | Admitting: Emergency Medicine

## 2024-03-06 ENCOUNTER — Emergency Department (HOSPITAL_COMMUNITY)
Admission: EM | Admit: 2024-03-06 | Discharge: 2024-03-06 | Disposition: A | Discharge status: Home / Self Care | Payer: Self-pay | Attending: Emergency Medicine | Admitting: Emergency Medicine

## 2024-03-06 DIAGNOSIS — R42 Dizziness and giddiness: Secondary | ICD-10-CM | POA: Insufficient documentation

## 2024-03-06 DIAGNOSIS — R519 Headache, unspecified: Secondary | ICD-10-CM | POA: Insufficient documentation

## 2024-03-06 DIAGNOSIS — R079 Chest pain, unspecified: Secondary | ICD-10-CM | POA: Insufficient documentation

## 2024-03-06 DIAGNOSIS — M542 Cervicalgia: Secondary | ICD-10-CM | POA: Insufficient documentation

## 2024-03-06 LAB — BASIC METABOLIC PANEL WITH GFR
Anion gap: 9 (ref 5–15)
BUN: 7 mg/dL (ref 6–20)
CO2: 26 mmol/L (ref 22–32)
Calcium: 8.9 mg/dL (ref 8.9–10.3)
Chloride: 102 mmol/L (ref 98–111)
Creatinine, Ser: 0.85 mg/dL (ref 0.44–1.00)
GFR, Estimated: >60 mL/min (ref >60)
Glucose, Bld: 116 mg/dL — ABNORMAL HIGH (ref 70–99)
Potassium: 3.6 mmol/L (ref 3.5–5.1)
Sodium: 137 mmol/L (ref 135–145)

## 2024-03-06 LAB — RESP PANEL BY RT-PCR (RSV, FLU A&B, COVID)  RVPGX2
Influenza A by PCR: NEGATIVE
Influenza B by PCR: NEGATIVE
Resp Syncytial Virus by PCR: NEGATIVE
SARS Coronavirus 2 by RT PCR: NEGATIVE

## 2024-03-06 LAB — CBC
HCT: 37.9 % (ref 36.0–46.0)
Hemoglobin: 12.7 g/dL (ref 12.0–15.0)
MCH: 31.7 pg (ref 26.0–34.0)
MCHC: 33.5 g/dL (ref 30.0–36.0)
MCV: 94.5 fL (ref 80.0–100.0)
Platelets: 241 K/uL (ref 150–400)
RBC: 4.01 MIL/uL (ref 3.87–5.11)
RDW: 12.2 % (ref 11.5–15.5)
WBC: 10.1 K/uL (ref 4.0–10.5)
nRBC: 0 % (ref 0.0–0.2)

## 2024-03-06 LAB — TROPONIN I (HIGH SENSITIVITY)
Troponin I (High Sensitivity): 3 ng/L (ref <18)
Troponin I (High Sensitivity): <2 ng/L (ref <18)

## 2024-03-06 LAB — HCG, SERUM, QUALITATIVE: Preg, Serum: NEGATIVE

## 2024-03-06 MED ORDER — FAMOTIDINE IN NACL 20-0.9 MG/50ML-% IV SOLN
20.0000 mg | Freq: Once | INTRAVENOUS | Status: AC
  Administered 2024-03-06: 20 mg via INTRAVENOUS
  Filled 2024-03-06: qty 50

## 2024-03-06 MED ORDER — SODIUM CHLORIDE 0.9 % IV BOLUS
500.0000 mL | Freq: Once | INTRAVENOUS | Status: AC
  Administered 2024-03-06: 500 mL via INTRAVENOUS

## 2024-03-06 MED ORDER — IOHEXOL 350 MG/ML SOLN
75.0000 mL | Freq: Once | INTRAVENOUS | Status: AC | PRN
  Administered 2024-03-06: 75 mL via INTRAVENOUS

## 2024-03-06 NOTE — ED Triage Notes (Signed)
 Pt c.o chest pain, feeling lightheaded and neck pain that started last night but worsened throughout the day

## 2024-03-06 NOTE — ED Provider Notes (Signed)
 [place] [facility] AT [facility] Provider Note   CSN: [id number] Arrival date & time: 03/06/24  1510     Patient presents with: Chest Pain and Dizziness   Rebecca Arnold is a 38 y.o. female.   HPI   38 year old female presents emergency department with concern for posterior neck pain, headache, lightheadedness.  Patient states that she was woken from sleep around 2 AM with some indigestion and anxiety.  She felt lightheaded, felt unsteady getting back to bed.  She easily fell asleep.  She woke up this morning and she feels generally fatigued and is primarily complaining of posterior neck tightness, generalized headache, lightheadedness.  Denies any focal numbness or weakness, no vision changes.  No back pain.  Denies any abdominal pain, recent fever.  Prior to Admission medications   Medication Sig Start Date End Date Taking? Authorizing Provider  triamcinolone  cream (KENALOG ) 0.1 % Apply 1 Application topically 2 (two) times daily. 02/14/24   Johnie Rumaldo LABOR, NP    Allergies: Penicillins    Review of Systems  Constitutional:  Positive for fatigue. Negative for fever.  Respiratory:  Negative for shortness of breath.   Cardiovascular:  Negative for chest pain.  Gastrointestinal:  Negative for abdominal pain, diarrhea and vomiting.       + Indigestion  Musculoskeletal:  Positive for neck pain. Negative for neck stiffness.  Skin:  Negative for rash.  Neurological:  Positive for light-headedness and headaches. Negative for tremors, syncope, facial asymmetry, speech difficulty, weakness and numbness.    Updated Vital Signs BP (!) 135/99   Pulse 78   Temp 97.8 F (36.6 C)   Resp 14   Ht 5' 7 (1.702 m)   Wt 90.7 kg   LMP 01/20/2024 (Exact Date)   SpO2 100%   BMI 31.32 kg/m   Physical Exam Vitals and nursing note reviewed.  Constitutional:      General: She is not in acute distress.    Appearance: Normal appearance.  HENT:     Head:  Normocephalic.     Mouth/Throat:     Mouth: Mucous membranes are moist.  Eyes:     Extraocular Movements: Extraocular movements intact.     Pupils: Pupils are equal, round, and reactive to light.  Cardiovascular:     Rate and Rhythm: Normal rate.  Pulmonary:     Effort: Pulmonary effort is normal. No respiratory distress.  Chest:     Chest wall: No tenderness or crepitus.  Abdominal:     Palpations: Abdomen is soft.     Tenderness: There is no abdominal tenderness.  Musculoskeletal:     Cervical back: Normal range of motion and neck supple.     Right lower leg: No edema.     Left lower leg: No edema.  Skin:    General: Skin is warm.  Neurological:     General: No focal deficit present.     Mental Status: She is alert and oriented to person, place, and time. Mental status is at baseline.     Cranial Nerves: No cranial nerve deficit.  Psychiatric:        Mood and Affect: Mood normal.     (all labs ordered are listed, but only abnormal results are displayed) Labs Reviewed  BASIC METABOLIC PANEL WITH GFR - Abnormal; Notable for the following components:      Result Value   Glucose, Bld 116 (*)    All other components within normal limits  CBC  HCG,  SERUM, QUALITATIVE  TROPONIN I (HIGH SENSITIVITY)  TROPONIN I (HIGH SENSITIVITY)    EKG: EKG Interpretation Date/Time:  Monday March 06 2024 15:17:13 EDT Ventricular Rate:  75 PR Interval:  148 QRS Duration:  78 QT Interval:  372 QTC Calculation: 415 R Axis:   41  Text Interpretation: Sinus rhythm with marked sinus arrhythmia Otherwise normal ECG No previous ECGs available Confirmed by Bari Flank [phone]) on 03/06/2024 4:36:41 PM  Radiology: [facility] Chest 2 View Result Date: 03/06/2024 CLINICAL DATA:  Chest pain and dizziness. EXAM: DG CHEST 2V COMPARISON:  09/16/2023. FINDINGS: The heart size and mediastinal contours are within normal limits. No consolidation, effusion, or pneumothorax. No acute osseous abnormality.  IMPRESSION: No active cardiopulmonary disease. Electronically Signed   By: Leita Birmingham M.D.   On: 03/06/2024 16:09     Procedures   Medications Ordered in the ED  sodium chloride  0.9 % bolus 500 mL (has no administration in time range)  famotidine  (PEPCID ) IVPB 20 mg premix (has no administration in time range)                                    Medical Decision Making Amount and/or Complexity of Data Reviewed Labs: ordered. Radiology: ordered.  Risk Prescription drug management.   38 year old female presents emergency department with multiple complaints.  Mainly complaining of heaviness in her chest as well as posterior neck pain, headache and lightheadedness.  Denies any fever, neuro deficit, GI symptoms.  Vitals are stable on arrival.  Patient is fatigued appearing but overall well.  EKG shows no ischemic changes.  Troponin is negative x 2, rest of the blood work is normal.  Respiratory panel is negative.  Chest x-ray shows no acute finding.  Given the complaint pattern CT angio of the head and neck was done which showed no acute finding.  After IV medication patient feels improved.  Low suspicion for ACS.  Low suspicion for PE, she is PERC negative at this time.  Work appears reassuring and I doubt acute/emergent pathology at this time.  Will plan for outpatient follow-up.  Patient at this time appears safe and stable for discharge and close outpatient follow up. Discharge plan and strict return to ED precautions discussed, patient verbalizes understanding and agreement.     Final diagnoses:  None    ED Discharge Orders     None          Bari Flank HERO, DO 03/06/24 2306

## 2024-03-06 NOTE — Discharge Instructions (Addendum)
 You have been seen and discharged from the emergency department.  Your heart and lung workup was normal.  The CT imaging of your neck, brain and vasculature was unremarkable.  The remainder of your blood work was normal for you.  Your respiratory viral swabs were negative.  Follow-up with your primary provider for further evaluation and further care. Take home medications as prescribed. If you have any worsening symptoms or further concerns for your health please return to an emergency department for further evaluation.

## 2024-04-20 ENCOUNTER — Ambulatory Visit
Admission: RE | Admit: 2024-04-20 | Discharge: 2024-04-20 | Disposition: A | Discharge status: Home / Self Care | Payer: Self-pay | Source: Ambulatory Visit

## 2024-04-20 VITALS — BP 118/89 | HR 92 | Temp 97.9°F | Resp 16

## 2024-04-20 DIAGNOSIS — L03213 Periorbital cellulitis: Secondary | ICD-10-CM

## 2024-04-20 MED ORDER — AZELASTINE HCL 0.05 % OP SOLN: 1.0000 [drp] | Freq: Two times a day (BID) | OPHTHALMIC | 0 refills | Status: DC

## 2024-04-20 NOTE — ED Provider Notes (Signed)
 [facility]  Note:  This document was prepared using Dragon voice recognition software and may include unintentional dictation errors.  MRN: [id number] DOB: 01-15-1986  Subjective:   Rebecca Arnold is a 38 y.o. female presenting for right upper eyelid swelling, redness, mucus drainage x 2 days.  Patient reports she started having some mild swelling yesterday at work but noticed this morning that right eyelid was crusted shut with mucus.  Patient was concerned that it could possibly be conjunctivitis.  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  No over-the-counter eyedrops or other medications used prior to arrival in urgent care.  No previous history of eye infections, does not wear contacts.  Patient states that she also had some yellowish fluid that came out of her bellybutton this morning she was concerned that possibly the 2 episodes were somewhat related.  Patient denies any pain, swelling, redness to the umbilicus.  No current facility-administered medications for this encounter.  Current Outpatient Medications:    azelastine (OPTIVAR) 0.05 % ophthalmic solution, Place 1 drop into the right eye 2 (two) times daily., Disp: 6 mL, Rfl: 0   triamcinolone  cream (KENALOG ) 0.1 %, Apply 1 Application topically 2 (two) times daily., Disp: 30 g, Rfl: 0   Allergies  Allergen Reactions   Penicillins Other (See Comments)    DID THE REACTION INVOLVE: Swelling of the face/tongue/throat, SOB, or low BP? Unknown Sudden or severe rash/hives, skin peeling, or the inside of the mouth or nose? Unknown Did it require medical treatment? Unknown When did it last happen?      pt was a child If all above answers are "NO", may proceed with cephalosporin use.     Past Medical History:  Diagnosis Date   Anxiety    Depression      History reviewed. No  pertinent surgical history.  History reviewed. No pertinent family history.  Social History   Tobacco Use   Smoking status: Former    Types: Cigarettes   Smokeless tobacco: Never  Vaping Use   Vaping status: Never Used  Substance Use Topics   Alcohol use: Yes    Comment: occasional   Drug use: No    ROS Refer to HPI for ROS details.  Objective:   Vitals: BP 118/89 (BP Location: Right Arm)   Pulse 92   Temp 97.9 F (36.6 C) (Oral)   Resp 16   LMP 04/15/2024 (Approximate)   SpO2 94%   Breastfeeding No   Physical Exam Vitals and nursing note reviewed.  Constitutional:      General: She is not in acute distress.    Appearance: Normal appearance. She is not ill-appearing.  HENT:     Head: Normocephalic.     Nose: Nose normal. No congestion.     Mouth/Throat:     Mouth: Mucous membranes are moist.     Pharynx: No posterior oropharyngeal erythema.  Eyes:     General:        Right eye: Discharge present.        Left eye: No discharge.     Extraocular Movements: Extraocular movements intact.     Conjunctiva/sclera: Conjunctivae normal.     Pupils: Pupils are equal, round, and reactive to light.  Cardiovascular:     Rate and Rhythm: Normal rate.  Pulmonary:     Effort: Pulmonary effort is normal. No  respiratory distress.  Skin:    General: Skin is warm and dry.     Capillary Refill: Capillary refill takes less than 2 seconds.  Neurological:     General: No focal deficit present.     Mental Status: She is alert and oriented to person, place, and time.  Psychiatric:        Mood and Affect: Mood normal.        Behavior: Behavior normal.     Procedures  No results found for this or any previous visit (from the past 24 hours).  No results found.   Assessment and Plan :     Discharge Instructions       1. Preseptal cellulitis of right upper eyelid (Primary) - azelastine (OPTIVAR) 0.05 % ophthalmic solution; Place 1 drop into the right eye 2 (two)  times daily.  Dispense: 6 mL; Refill: 0 - Apply cool compress to right upper eyelid to decrease swelling and inflammation. - Avoid touching eyelid or rubbing eyes is much as possible as this can increase swelling. - Continue to monitor for escalation of symptoms if there is any change in your current symptoms or development of new symptoms follow-up for further evaluation and treatment.      Virginia Curl B Tashanti Dalporto   Korynne Dols, [place] B, [place] 04/20/24 1512

## 2024-04-20 NOTE — ED Triage Notes (Signed)
 Pt c/o right eye swelling, redness, and drainage for 2 day. States she also has had fluid come out of belly button today.

## 2024-04-20 NOTE — Discharge Instructions (Signed)
  1. Preseptal cellulitis of right upper eyelid (Primary) - azelastine (OPTIVAR) 0.05 % ophthalmic solution; Place 1 drop into the right eye 2 (two) times daily.  Dispense: 6 mL; Refill: 0 - Apply cool compress to right upper eyelid to decrease swelling and inflammation. - Avoid touching eyelid or rubbing eyes is much as possible as this can increase swelling. - Continue to monitor for escalation of symptoms if there is any change in your current symptoms or development of new symptoms follow-up for further evaluation and treatment.

## 2024-06-07 ENCOUNTER — Other Ambulatory Visit: Payer: Self-pay

## 2024-06-07 ENCOUNTER — Ambulatory Visit
Admission: RE | Admit: 2024-06-07 | Discharge: 2024-06-07 | Disposition: A | Discharge status: Home / Self Care | Attending: Nurse Practitioner | Admitting: Nurse Practitioner

## 2024-06-07 VITALS — BP 116/84 | HR 103 | Temp 98.3°F | Resp 18 | Ht 67.0 in | Wt 210.0 lb

## 2024-06-07 DIAGNOSIS — J069 Acute upper respiratory infection, unspecified: Secondary | ICD-10-CM

## 2024-06-07 LAB — POC COVID19/FLU A&B COMBO
Covid Antigen, POC: NEGATIVE
Influenza A Antigen, POC: NEGATIVE
Influenza B Antigen, POC: NEGATIVE

## 2024-06-07 MED ORDER — MUCINEX DM MAXIMUM STRENGTH 60-1200 MG PO TB12: 1.0000 | ORAL_TABLET | Freq: Two times a day (BID) | ORAL | 0 refills | Status: AC

## 2024-06-07 MED ORDER — CETIRIZINE-PSEUDOEPHEDRINE ER 5-120 MG PO TB12: 1.0000 | ORAL_TABLET | Freq: Every day | ORAL | 0 refills | Status: AC

## 2024-06-07 MED ORDER — FLUTICASONE PROPIONATE 50 MCG/ACT NA SUSP: 2.0000 | Freq: Every day | NASAL | 0 refills | Status: AC

## 2024-06-07 MED ORDER — ALBUTEROL SULFATE HFA 108 (90 BASE) MCG/ACT IN AERS: 2.0000 | INHALATION_SPRAY | RESPIRATORY_TRACT | 0 refills | Status: AC | PRN

## 2024-06-07 MED ORDER — PROMETHAZINE-DM 6.25-15 MG/5ML PO SYRP: 10.0000 mL | ORAL_SOLUTION | Freq: Four times a day (QID) | ORAL | 0 refills | Status: AC | PRN

## 2024-06-07 NOTE — Discharge Instructions (Addendum)
 Your COVID and flu tests were negative today. Your symptoms are most likely due to a respiratory infection affecting your nose, throat, or lungs. These infections are usually caused by a virus, which means antibiotics will not help since they only treat bacterial infections. Please take any medications prescribed to you as directed.  Please take the medications prescribed to you as directed. For fever, body aches, or throat pain, you may use acetaminophen  (Tylenol ) or ibuprofen  (Advil , Motrin ) as needed. For nasal congestion, saline nasal spray or saline rinses can also be used freely to help clear mucus. Sore throat discomfort may improve with throat lozenges, warm saltwater gargles, or menthol/breathing strips. These treatments do not cure the illness but will help you feel more comfortable while your body heals. Staying hydrated is very important--drink plenty of fluids and aim for pale yellow urine. Using a cool mist humidifier, sitting in steam from a warm shower, and elevating your head when sleeping can also help with congestion and drainage. Be sure to replace your toothbrush once you start feeling better.  A cough can linger for several weeks after a respiratory infection even as other symptoms improve. This is normal as long as the cough is slowly getting better. However, if your symptoms worsen, you have trouble breathing, chest pain, new high fever, coughing up blood, or you are unable to keep fluids down, please go to the emergency room. Follow up with your primary care provider if your symptoms are not improving within the next week.

## 2024-06-07 NOTE — ED Provider Notes (Signed)
 [facility]    CSN: [id number] Arrival date & time: 06/07/24  9173      History   Chief Complaint Chief Complaint  Patient presents with   Sore Throat    Sick visit, chest congestion, nasal congestion so bad my teeth hurt, extreme fatigue, sore throat, headache, low grade fever off and on for two days, this is day 3 - Entered by patient   Nasal Congestion    HPI Rebecca Arnold is a 38 y.o. female.   Discussed the use of AI scribe software for clinical note transcription with the patient, who gave verbal consent to proceed.   The patient presents with a three-day history of acute upper respiratory symptoms, beginning with sore throat and progressively worsening to include nasal congestion, headache, facial pain, chest congestion, and cough. She reports fevers as high as 103F accompanied by chills and body aches. Prominent nasal symptoms include frequent sneezing, rhinorrhea, and significant post-nasal drainage. The patient describes a productive cough with some sputum production, stating she feels she is getting more out of my nose than coughing up, and notes that repeated coughing has resulted in rib discomfort. She reports intermittent shortness of breath, which she attributes to severe nasal and chest congestion rather than wheezing or airway tightness.  The patient has attempted symptom relief with over-the-counter treatments including DayQuil, throat lozenges, ibuprofen , and Mucinex without significant improvement, stating the medications do not feel like they are touching it. She denies nausea, vomiting, diarrhea, smoking, or vaping.     Past Medical History:  Diagnosis Date   Anxiety    Depression     There are no active problems to display for this patient.   History reviewed. No pertinent surgical history.  OB History     Gravida  1   Para      Term      Preterm      AB      Living         SAB      IAB      Ectopic       Multiple      Live Births               Home Medications    Prior to Admission medications   Medication Sig Start Date End Date Taking? Authorizing Provider  albuterol (VENTOLIN HFA) 108 (90 Base) MCG/ACT inhaler Inhale 2 puffs into the lungs every 4 (four) hours as needed for wheezing or shortness of breath (bronchospasms). 06/07/24  Yes Harbour Nordmeyer, Lucie, FNP  cetirizine-pseudoephedrine (ZYRTEC-D) 5-120 MG tablet Take 1 tablet by mouth daily with breakfast for 10 days. 06/07/24 06/17/24 Yes Beatrice Sehgal, Lucie, FNP  Dextromethorphan-guaiFENesin (MUCINEX DM MAXIMUM STRENGTH) 60-1200 MG TB12 Take 1 tablet by mouth 2 (two) times daily. 06/07/24  Yes Elmin Wiederholt, FNP  fluticasone (FLONASE) 50 MCG/ACT nasal spray Place 2 sprays into both nostrils daily. Shake well before use. Gently blow nose before spraying. Do not blow nose immediately after use. You should not taste the medication or feel it going down your throat; if you do, adjust your technique. 06/07/24  Yes Iola Lucie, FNP  promethazine-dextromethorphan (PROMETHAZINE-DM) 6.25-15 MG/5ML syrup Take 10 mLs by mouth every 6 (six) hours as needed for cough. 06/07/24  Yes Iola Lucie, FNP    Family History History reviewed. No pertinent family history.  Social History Social History   Tobacco Use   Smoking status: Former    Types: Cigarettes   Smokeless tobacco: Never  Vaping Use   Vaping status: Never Used  Substance Use Topics   Alcohol use: Yes    Comment: occasional   Drug use: No     Allergies   Penicillins   Review of Systems Review of Systems  Constitutional:  Positive for chills and fever (ranging 101-103).  HENT:  Positive for congestion, postnasal drip, rhinorrhea, sneezing and sore throat.   Respiratory:  Positive for cough (mildy productive) and shortness of breath (due to the congestion). Negative for wheezing.        Chest congestion  Gastrointestinal:  Negative for diarrhea, nausea and  vomiting.  Musculoskeletal:  Positive for myalgias (due to coughing a lot).  Neurological:  Positive for headaches.  All other systems reviewed and are negative.    Physical Exam Triage Vital Signs ED Triage Vitals  Encounter Vitals Group     BP 06/07/24 0844 116/84     Girls Systolic BP Percentile --      Girls Diastolic BP Percentile --      Boys Systolic BP Percentile --      Boys Diastolic BP Percentile --      Pulse Rate 06/07/24 0844 (!) 103     Resp 06/07/24 0844 18     Temp 06/07/24 0844 98.3 F (36.8 C)     Temp Source 06/07/24 0844 Oral     SpO2 06/07/24 0844 95 %     Weight 06/07/24 0843 210 lb (95.3 kg)     Height 06/07/24 0843 5' 7 (1.702 m)     Head Circumference --      Peak Flow --      Pain Score 06/07/24 0842 7     Pain Loc --      Pain Education --      Exclude from Growth Chart --    No data found.  Updated Vital Signs BP 116/84 (BP Location: Right Arm)   Pulse (!) 103   Temp 98.3 F (36.8 C) (Oral)   Resp 18   Ht 5' 7 (1.702 m)   Wt 210 lb (95.3 kg)   LMP 06/05/2024 (Exact Date)   SpO2 95%   BMI 32.89 kg/m   Visual Acuity Right Eye Distance:   Left Eye Distance:   Bilateral Distance:    Right Eye Near:   Left Eye Near:    Bilateral Near:     Physical Exam Vitals reviewed.  Constitutional:      General: She is awake. She is not in acute distress.    Appearance: Normal appearance. She is well-developed. She is not ill-appearing, toxic-appearing or diaphoretic.  HENT:     Head: Normocephalic.     Right Ear: Tympanic membrane, ear canal and external ear normal. No drainage, swelling or tenderness. No middle ear effusion. Tympanic membrane is not erythematous.     Left Ear: Tympanic membrane, ear canal and external ear normal. No drainage, swelling or tenderness.  No middle ear effusion. Tympanic membrane is not erythematous.     Nose: Congestion present. No rhinorrhea.     Mouth/Throat:     Lips: Pink.     Mouth: Mucous membranes  are moist.     Pharynx: No pharyngeal swelling, oropharyngeal exudate, posterior oropharyngeal erythema or uvula swelling.     Tonsils: No tonsillar exudate or tonsillar abscesses.  Eyes:     General: Vision grossly intact.     Conjunctiva/sclera: Conjunctivae normal.  Cardiovascular:     Rate and Rhythm: Normal rate.  Heart sounds: Normal heart sounds.  Pulmonary:     Effort: Pulmonary effort is normal. No tachypnea or respiratory distress.     Breath sounds: Normal breath sounds and air entry. No decreased air movement. No decreased breath sounds or wheezing.     Comments: Respirations even and unlabored  Musculoskeletal:        General: Normal range of motion.     Cervical back: Normal range of motion and neck supple.  Lymphadenopathy:     Cervical: No cervical adenopathy.  Skin:    General: Skin is warm and dry.  Neurological:     General: No focal deficit present.     Mental Status: She is alert and oriented to person, place, and time.  Psychiatric:        Behavior: Behavior is cooperative.      UC Treatments / Results  Labs (all labs ordered are listed, but only abnormal results are displayed) Labs Reviewed  POC COVID19/FLU A&B COMBO - Normal    EKG   Radiology No results found.  Procedures Procedures (including critical care time)  Medications Ordered in UC Medications - No data to display  Initial Impression / Assessment and Plan / UC Course  I have reviewed the triage vital signs and the nursing notes.  Pertinent labs & imaging results that were available during my care of the patient were reviewed by me and considered in my medical decision making (see chart for details).     The patient presents with symptoms consistent with a viral upper respiratory infection. COVID and FLU tests were negative. Exam is reassuring and no evidence of bacterial infection or acute cardiopulmonary process is noted. Supportive care is recommended. Patient was advised to  follow up with primary care if symptoms do not improve within one week or if new concerns arise. Instructions were given to seek emergency care if symptoms worsen, including shortness of breath, chest pain, persistent high fever, inability to tolerate fluids, or confusion.  Today's evaluation has revealed no signs of a dangerous process. Discussed diagnosis with patient and/or guardian. Patient and/or guardian aware of their diagnosis, possible red flag symptoms to watch out for and need for close follow up. Patient and/or guardian understands verbal and written discharge instructions. Patient and/or guardian comfortable with plan and disposition.  Patient and/or guardian has a clear mental status at this time, good insight into illness (after discussion and teaching) and has clear judgment to make decisions regarding their care  Documentation was completed with the aid of voice recognition software. Transcription may contain typographical errors.  Final Clinical Impressions(s) / UC Diagnoses   Final diagnoses:  Viral upper respiratory tract infection     Discharge Instructions      Your COVID and flu tests were negative today. Your symptoms are most likely due to a respiratory infection affecting your nose, throat, or lungs. These infections are usually caused by a virus, which means antibiotics will not help since they only treat bacterial infections. Please take any medications prescribed to you as directed.  Please take the medications prescribed to you as directed. For fever, body aches, or throat pain, you may use acetaminophen  (Tylenol ) or ibuprofen  (Advil , Motrin ) as needed. For nasal congestion, saline nasal spray or saline rinses can also be used freely to help clear mucus. Sore throat discomfort may improve with throat lozenges, warm saltwater gargles, or menthol/breathing strips. These treatments do not cure the illness but will help you feel more comfortable while your body heals.  Staying hydrated is very important--drink plenty of fluids and aim for pale yellow urine. Using a cool mist humidifier, sitting in steam from a warm shower, and elevating your head when sleeping can also help with congestion and drainage. Be sure to replace your toothbrush once you start feeling better.  A cough can linger for several weeks after a respiratory infection even as other symptoms improve. This is normal as long as the cough is slowly getting better. However, if your symptoms worsen, you have trouble breathing, chest pain, new high fever, coughing up blood, or you are unable to keep fluids down, please go to the emergency room. Follow up with your primary care provider if your symptoms are not improving within the next week.             ED Prescriptions     Medication Sig Dispense Auth. Provider   albuterol (VENTOLIN HFA) 108 (90 Base) MCG/ACT inhaler Inhale 2 puffs into the lungs every 4 (four) hours as needed for wheezing or shortness of breath (bronchospasms). 18 g Laloni Rowton, FNP   fluticasone (FLONASE) 50 MCG/ACT nasal spray Place 2 sprays into both nostrils daily. Shake well before use. Gently blow nose before spraying. Do not blow nose immediately after use. You should not taste the medication or feel it going down your throat; if you do, adjust your technique. 16 g Jennifer Holland, [place], FNP   cetirizine-pseudoephedrine (ZYRTEC-D) 5-120 MG tablet Take 1 tablet by mouth daily with breakfast for 10 days. 10 tablet Joshuan Bolander, [place], FNP   Dextromethorphan-guaiFENesin (MUCINEX DM MAXIMUM STRENGTH) 60-1200 MG TB12 Take 1 tablet by mouth 2 (two) times daily. 20 tablet Iola Lukes, FNP   promethazine-dextromethorphan (PROMETHAZINE-DM) 6.25-15 MG/5ML syrup Take 10 mLs by mouth every 6 (six) hours as needed for cough. 118 mL Iola Lukes, FNP      PDMP not reviewed this encounter.   Iola Lukes, [place] 06/07/24 1114

## 2024-06-07 NOTE — ED Notes (Signed)
 Pt states she is also taking Mucinex and Ibuprofen  for her symptoms.

## 2024-06-07 NOTE — ED Triage Notes (Signed)
 Pt presents with complaints of sore throat, nasal congestion, headaches, and facial pain. This is day 3 of symptoms. Currently rates pain in face (teeth area) a 7/10. Has been taking Dayquil + throat lozenges for symptoms with minimal improvement. Denies sick contacts. Does endorse temperatures of 101 F-103 F with forehead thermometer.
